# Patient Record
Sex: Female | Born: 1965 | Race: White | Hispanic: No | Marital: Married | Smoking: Never smoker
Health system: Southern US, Community
[De-identification: ages and names within clinical notes are randomized; demographics above are authoritative.]

---

## 1997-08-06 ENCOUNTER — Other Ambulatory Visit: Admission: RE | Admit: 1997-08-06 | Discharge: 1997-08-06 | Payer: Self-pay | Admitting: *Deleted

## 1998-07-13 ENCOUNTER — Inpatient Hospital Stay (HOSPITAL_COMMUNITY): Admission: AD | Admit: 1998-07-13 | Discharge: 1998-07-16 | Payer: Self-pay | Admitting: *Deleted

## 1998-08-27 ENCOUNTER — Other Ambulatory Visit: Admission: RE | Admit: 1998-08-27 | Discharge: 1998-08-27 | Payer: Self-pay | Admitting: *Deleted

## 1998-12-30 ENCOUNTER — Other Ambulatory Visit: Admission: RE | Admit: 1998-12-30 | Discharge: 1998-12-30 | Payer: Self-pay | Admitting: *Deleted

## 1999-03-08 ENCOUNTER — Encounter: Payer: Self-pay | Admitting: *Deleted

## 1999-03-08 ENCOUNTER — Ambulatory Visit (HOSPITAL_COMMUNITY): Admission: RE | Admit: 1999-03-08 | Discharge: 1999-03-08 | Payer: Self-pay | Admitting: *Deleted

## 1999-09-06 ENCOUNTER — Other Ambulatory Visit: Admission: RE | Admit: 1999-09-06 | Discharge: 1999-09-06 | Payer: Self-pay | Admitting: *Deleted

## 1999-11-25 ENCOUNTER — Other Ambulatory Visit: Admission: RE | Admit: 1999-11-25 | Discharge: 1999-11-25 | Payer: Self-pay | Admitting: Obstetrics and Gynecology

## 1999-11-25 ENCOUNTER — Encounter (INDEPENDENT_AMBULATORY_CARE_PROVIDER_SITE_OTHER): Payer: Self-pay

## 2000-03-17 ENCOUNTER — Other Ambulatory Visit: Admission: RE | Admit: 2000-03-17 | Discharge: 2000-03-17 | Payer: Self-pay | Admitting: *Deleted

## 2000-04-05 ENCOUNTER — Encounter: Payer: Self-pay | Admitting: *Deleted

## 2000-04-05 ENCOUNTER — Ambulatory Visit (HOSPITAL_COMMUNITY): Admission: RE | Admit: 2000-04-05 | Discharge: 2000-04-05 | Payer: Self-pay | Admitting: *Deleted

## 2000-09-12 ENCOUNTER — Other Ambulatory Visit: Admission: RE | Admit: 2000-09-12 | Discharge: 2000-09-12 | Payer: Self-pay | Admitting: *Deleted

## 2001-05-02 ENCOUNTER — Ambulatory Visit (HOSPITAL_COMMUNITY): Admission: RE | Admit: 2001-05-02 | Discharge: 2001-05-02 | Payer: Self-pay | Admitting: *Deleted

## 2001-05-02 ENCOUNTER — Encounter: Payer: Self-pay | Admitting: *Deleted

## 2001-09-14 ENCOUNTER — Other Ambulatory Visit: Admission: RE | Admit: 2001-09-14 | Discharge: 2001-09-14 | Payer: Self-pay | Admitting: Obstetrics and Gynecology

## 2002-05-22 ENCOUNTER — Ambulatory Visit (HOSPITAL_COMMUNITY): Admission: RE | Admit: 2002-05-22 | Discharge: 2002-05-22 | Payer: Self-pay | Admitting: Obstetrics and Gynecology

## 2002-05-22 ENCOUNTER — Encounter: Payer: Self-pay | Admitting: Obstetrics and Gynecology

## 2002-09-23 ENCOUNTER — Other Ambulatory Visit: Admission: RE | Admit: 2002-09-23 | Discharge: 2002-09-23 | Payer: Self-pay | Admitting: Obstetrics and Gynecology

## 2003-06-18 ENCOUNTER — Ambulatory Visit (HOSPITAL_COMMUNITY): Admission: RE | Admit: 2003-06-18 | Discharge: 2003-06-18 | Payer: Self-pay | Admitting: Obstetrics and Gynecology

## 2004-07-12 ENCOUNTER — Ambulatory Visit (HOSPITAL_COMMUNITY): Admission: RE | Admit: 2004-07-12 | Discharge: 2004-07-12 | Payer: Self-pay | Admitting: Obstetrics and Gynecology

## 2005-07-14 ENCOUNTER — Ambulatory Visit (HOSPITAL_COMMUNITY): Admission: RE | Admit: 2005-07-14 | Discharge: 2005-07-14 | Payer: Self-pay | Admitting: Obstetrics and Gynecology

## 2006-09-14 ENCOUNTER — Ambulatory Visit (HOSPITAL_COMMUNITY): Admission: RE | Admit: 2006-09-14 | Discharge: 2006-09-14 | Payer: Self-pay | Admitting: Obstetrics and Gynecology

## 2007-09-24 ENCOUNTER — Ambulatory Visit (HOSPITAL_COMMUNITY): Admission: RE | Admit: 2007-09-24 | Discharge: 2007-09-24 | Payer: Self-pay | Admitting: Obstetrics and Gynecology

## 2008-10-23 ENCOUNTER — Ambulatory Visit (HOSPITAL_COMMUNITY): Admission: RE | Admit: 2008-10-23 | Discharge: 2008-10-23 | Payer: Self-pay | Admitting: Obstetrics and Gynecology

## 2009-11-12 ENCOUNTER — Ambulatory Visit (HOSPITAL_COMMUNITY): Admission: RE | Admit: 2009-11-12 | Discharge: 2009-11-12 | Payer: Self-pay | Admitting: Obstetrics and Gynecology

## 2010-11-04 ENCOUNTER — Other Ambulatory Visit (HOSPITAL_COMMUNITY): Payer: Self-pay | Admitting: Obstetrics and Gynecology

## 2010-11-04 DIAGNOSIS — Z1231 Encounter for screening mammogram for malignant neoplasm of breast: Secondary | ICD-10-CM

## 2010-11-15 ENCOUNTER — Ambulatory Visit (HOSPITAL_COMMUNITY)
Admission: RE | Admit: 2010-11-15 | Discharge: 2010-11-15 | Disposition: A | Discharge status: Home / Self Care | Payer: BC Managed Care – PPO | Source: Ambulatory Visit | Attending: Obstetrics and Gynecology | Admitting: Obstetrics and Gynecology

## 2010-11-15 DIAGNOSIS — Z1231 Encounter for screening mammogram for malignant neoplasm of breast: Secondary | ICD-10-CM | POA: Insufficient documentation

## 2011-11-09 ENCOUNTER — Other Ambulatory Visit (HOSPITAL_COMMUNITY): Payer: Self-pay | Admitting: Obstetrics and Gynecology

## 2011-11-09 DIAGNOSIS — Z1231 Encounter for screening mammogram for malignant neoplasm of breast: Secondary | ICD-10-CM

## 2011-11-29 ENCOUNTER — Ambulatory Visit (HOSPITAL_COMMUNITY): Payer: BC Managed Care – PPO

## 2011-12-13 ENCOUNTER — Ambulatory Visit (HOSPITAL_COMMUNITY): Payer: BC Managed Care – PPO

## 2012-01-03 ENCOUNTER — Ambulatory Visit (HOSPITAL_COMMUNITY): Payer: BC Managed Care – PPO | Attending: Obstetrics and Gynecology

## 2012-01-27 ENCOUNTER — Ambulatory Visit (HOSPITAL_COMMUNITY)
Admission: RE | Admit: 2012-01-27 | Discharge: 2012-01-27 | Disposition: A | Discharge status: Home / Self Care | Payer: BC Managed Care – PPO | Source: Ambulatory Visit | Attending: Obstetrics and Gynecology | Admitting: Obstetrics and Gynecology

## 2012-01-27 DIAGNOSIS — Z1231 Encounter for screening mammogram for malignant neoplasm of breast: Secondary | ICD-10-CM | POA: Insufficient documentation

## 2012-02-02 ENCOUNTER — Ambulatory Visit (HOSPITAL_COMMUNITY): Payer: BC Managed Care – PPO

## 2013-03-12 ENCOUNTER — Other Ambulatory Visit (HOSPITAL_COMMUNITY): Payer: Self-pay | Admitting: Obstetrics and Gynecology

## 2013-03-12 DIAGNOSIS — Z1231 Encounter for screening mammogram for malignant neoplasm of breast: Secondary | ICD-10-CM

## 2013-03-13 ENCOUNTER — Ambulatory Visit (HOSPITAL_COMMUNITY)
Admission: RE | Admit: 2013-03-13 | Discharge: 2013-03-13 | Disposition: A | Discharge status: Home / Self Care | Payer: BC Managed Care – PPO | Source: Ambulatory Visit | Attending: Obstetrics and Gynecology | Admitting: Obstetrics and Gynecology

## 2013-03-13 DIAGNOSIS — Z1231 Encounter for screening mammogram for malignant neoplasm of breast: Secondary | ICD-10-CM | POA: Insufficient documentation

## 2013-03-29 ENCOUNTER — Other Ambulatory Visit: Payer: Self-pay | Admitting: Obstetrics and Gynecology

## 2013-03-29 DIAGNOSIS — R928 Other abnormal and inconclusive findings on diagnostic imaging of breast: Secondary | ICD-10-CM

## 2013-04-04 ENCOUNTER — Ambulatory Visit
Admission: RE | Admit: 2013-04-04 | Discharge: 2013-04-04 | Disposition: A | Discharge status: Home / Self Care | Payer: BC Managed Care – PPO | Source: Ambulatory Visit | Attending: Obstetrics and Gynecology | Admitting: Obstetrics and Gynecology

## 2013-04-04 DIAGNOSIS — R928 Other abnormal and inconclusive findings on diagnostic imaging of breast: Secondary | ICD-10-CM

## 2014-04-22 ENCOUNTER — Other Ambulatory Visit (HOSPITAL_COMMUNITY): Payer: Self-pay | Admitting: Obstetrics and Gynecology

## 2014-04-22 DIAGNOSIS — Z1231 Encounter for screening mammogram for malignant neoplasm of breast: Secondary | ICD-10-CM

## 2014-05-01 ENCOUNTER — Ambulatory Visit (HOSPITAL_COMMUNITY): Payer: BC Managed Care – PPO

## 2014-05-08 ENCOUNTER — Ambulatory Visit (HOSPITAL_COMMUNITY)
Admission: RE | Admit: 2014-05-08 | Discharge: 2014-05-08 | Disposition: A | Discharge status: Home / Self Care | Payer: BC Managed Care – PPO | Source: Ambulatory Visit | Attending: Obstetrics and Gynecology | Admitting: Obstetrics and Gynecology

## 2014-05-08 DIAGNOSIS — Z1231 Encounter for screening mammogram for malignant neoplasm of breast: Secondary | ICD-10-CM | POA: Insufficient documentation

## 2015-07-08 ENCOUNTER — Other Ambulatory Visit: Payer: Self-pay

## 2015-07-08 DIAGNOSIS — Z1231 Encounter for screening mammogram for malignant neoplasm of breast: Secondary | ICD-10-CM

## 2015-07-10 ENCOUNTER — Ambulatory Visit
Admission: RE | Admit: 2015-07-10 | Discharge: 2015-07-10 | Disposition: A | Discharge status: Home / Self Care | Payer: BC Managed Care – PPO | Source: Ambulatory Visit

## 2015-07-10 DIAGNOSIS — Z1231 Encounter for screening mammogram for malignant neoplasm of breast: Secondary | ICD-10-CM

## 2016-08-02 ENCOUNTER — Other Ambulatory Visit: Payer: Self-pay | Admitting: Obstetrics and Gynecology

## 2016-08-02 DIAGNOSIS — Z1231 Encounter for screening mammogram for malignant neoplasm of breast: Secondary | ICD-10-CM

## 2016-08-29 ENCOUNTER — Ambulatory Visit
Admission: RE | Admit: 2016-08-29 | Discharge: 2016-08-29 | Disposition: A | Discharge status: Home / Self Care | Payer: BC Managed Care – PPO | Source: Ambulatory Visit | Attending: Obstetrics and Gynecology | Admitting: Obstetrics and Gynecology

## 2016-08-29 DIAGNOSIS — Z1231 Encounter for screening mammogram for malignant neoplasm of breast: Secondary | ICD-10-CM

## 2017-08-25 ENCOUNTER — Other Ambulatory Visit: Payer: Self-pay | Admitting: Obstetrics and Gynecology

## 2017-08-25 DIAGNOSIS — Z1231 Encounter for screening mammogram for malignant neoplasm of breast: Secondary | ICD-10-CM

## 2017-09-13 ENCOUNTER — Ambulatory Visit
Admission: RE | Admit: 2017-09-13 | Discharge: 2017-09-13 | Disposition: A | Discharge status: Home / Self Care | Payer: BC Managed Care – PPO | Source: Ambulatory Visit | Attending: Obstetrics and Gynecology | Admitting: Obstetrics and Gynecology

## 2017-09-13 DIAGNOSIS — Z1231 Encounter for screening mammogram for malignant neoplasm of breast: Secondary | ICD-10-CM

## 2018-08-29 ENCOUNTER — Other Ambulatory Visit: Payer: Self-pay | Admitting: Obstetrics and Gynecology

## 2018-08-29 DIAGNOSIS — Z1231 Encounter for screening mammogram for malignant neoplasm of breast: Secondary | ICD-10-CM

## 2018-10-15 ENCOUNTER — Ambulatory Visit
Admission: RE | Admit: 2018-10-15 | Discharge: 2018-10-15 | Disposition: A | Discharge status: Home / Self Care | Payer: BC Managed Care – PPO | Source: Ambulatory Visit | Attending: Obstetrics and Gynecology | Admitting: Obstetrics and Gynecology

## 2018-10-15 ENCOUNTER — Other Ambulatory Visit: Payer: Self-pay

## 2018-10-15 DIAGNOSIS — Z1231 Encounter for screening mammogram for malignant neoplasm of breast: Secondary | ICD-10-CM

## 2019-06-04 ENCOUNTER — Inpatient Hospital Stay (HOSPITAL_COMMUNITY): Payer: BC Managed Care – PPO | Admitting: Certified Registered Nurse Anesthetist

## 2019-06-04 ENCOUNTER — Emergency Department (HOSPITAL_COMMUNITY): Payer: BC Managed Care – PPO

## 2019-06-04 ENCOUNTER — Inpatient Hospital Stay (HOSPITAL_COMMUNITY): Payer: BC Managed Care – PPO

## 2019-06-04 ENCOUNTER — Inpatient Hospital Stay (HOSPITAL_COMMUNITY)
Admission: EM | Admit: 2019-06-04 | Discharge: 2019-06-11 | DRG: 493 | Disposition: A | Discharge status: Home Health | Payer: BC Managed Care – PPO | Attending: Student | Admitting: Student

## 2019-06-04 ENCOUNTER — Encounter (HOSPITAL_COMMUNITY)
Admission: EM | Disposition: A | Discharge status: Home Health | Payer: Self-pay | Source: Home / Self Care | Attending: Student

## 2019-06-04 ENCOUNTER — Encounter (HOSPITAL_COMMUNITY): Payer: Self-pay | Admitting: Orthopaedic Surgery

## 2019-06-04 ENCOUNTER — Other Ambulatory Visit: Payer: Self-pay

## 2019-06-04 DIAGNOSIS — S82872A Displaced pilon fracture of left tibia, initial encounter for closed fracture: Principal | ICD-10-CM

## 2019-06-04 DIAGNOSIS — Z803 Family history of malignant neoplasm of breast: Secondary | ICD-10-CM | POA: Diagnosis not present

## 2019-06-04 DIAGNOSIS — D62 Acute posthemorrhagic anemia: Secondary | ICD-10-CM | POA: Diagnosis not present

## 2019-06-04 DIAGNOSIS — S82209A Unspecified fracture of shaft of unspecified tibia, initial encounter for closed fracture: Secondary | ICD-10-CM | POA: Insufficient documentation

## 2019-06-04 DIAGNOSIS — Z751 Person awaiting admission to adequate facility elsewhere: Secondary | ICD-10-CM | POA: Diagnosis not present

## 2019-06-04 DIAGNOSIS — Z20822 Contact with and (suspected) exposure to covid-19: Secondary | ICD-10-CM | POA: Diagnosis present

## 2019-06-04 DIAGNOSIS — M25572 Pain in left ankle and joints of left foot: Secondary | ICD-10-CM | POA: Diagnosis present

## 2019-06-04 DIAGNOSIS — T148XXA Other injury of unspecified body region, initial encounter: Secondary | ICD-10-CM

## 2019-06-04 DIAGNOSIS — S62109A Fracture of unspecified carpal bone, unspecified wrist, initial encounter for closed fracture: Secondary | ICD-10-CM

## 2019-06-04 DIAGNOSIS — Z09 Encounter for follow-up examination after completed treatment for conditions other than malignant neoplasm: Secondary | ICD-10-CM

## 2019-06-04 DIAGNOSIS — Z79899 Other long term (current) drug therapy: Secondary | ICD-10-CM | POA: Diagnosis not present

## 2019-06-04 DIAGNOSIS — S82392A Other fracture of lower end of left tibia, initial encounter for closed fracture: Secondary | ICD-10-CM

## 2019-06-04 DIAGNOSIS — Z419 Encounter for procedure for purposes other than remedying health state, unspecified: Secondary | ICD-10-CM

## 2019-06-04 DIAGNOSIS — S52552A Other extraarticular fracture of lower end of left radius, initial encounter for closed fracture: Secondary | ICD-10-CM | POA: Diagnosis present

## 2019-06-04 DIAGNOSIS — W010XXA Fall on same level from slipping, tripping and stumbling without subsequent striking against object, initial encounter: Secondary | ICD-10-CM | POA: Diagnosis present

## 2019-06-04 DIAGNOSIS — Z880 Allergy status to penicillin: Secondary | ICD-10-CM | POA: Diagnosis not present

## 2019-06-04 DIAGNOSIS — S52502A Unspecified fracture of the lower end of left radius, initial encounter for closed fracture: Secondary | ICD-10-CM | POA: Diagnosis present

## 2019-06-04 DIAGNOSIS — W19XXXA Unspecified fall, initial encounter: Secondary | ICD-10-CM

## 2019-06-04 HISTORY — PX: EXTERNAL FIXATION LEG: SHX1549

## 2019-06-04 HISTORY — PX: ORIF WRIST FRACTURE: SHX2133

## 2019-06-04 LAB — CBC
HCT: 36.8 % (ref 36.0–46.0)
Hemoglobin: 11.9 g/dL — ABNORMAL LOW (ref 12.0–15.0)
MCH: 30.1 pg (ref 26.0–34.0)
MCHC: 32.3 g/dL (ref 30.0–36.0)
MCV: 92.9 fL (ref 80.0–100.0)
Platelets: 226 10*3/uL (ref 150–400)
RBC: 3.96 MIL/uL (ref 3.87–5.11)
RDW: 12.9 % (ref 11.5–15.5)
WBC: 9.2 10*3/uL (ref 4.0–10.5)
nRBC: 0 % (ref 0.0–0.2)

## 2019-06-04 LAB — BASIC METABOLIC PANEL
Anion gap: 10 (ref 5–15)
BUN: 17 mg/dL (ref 6–20)
CO2: 26 mmol/L (ref 22–32)
Calcium: 8.7 mg/dL — ABNORMAL LOW (ref 8.9–10.3)
Chloride: 102 mmol/L (ref 98–111)
Creatinine, Ser: 0.86 mg/dL (ref 0.44–1.00)
GFR calc Af Amer: >60 mL/min (ref >60)
GFR calc non Af Amer: >60 mL/min (ref >60)
Glucose, Bld: 150 mg/dL — ABNORMAL HIGH (ref 70–99)
Potassium: 3.8 mmol/L (ref 3.5–5.1)
Sodium: 138 mmol/L (ref 135–145)

## 2019-06-04 LAB — RESPIRATORY PANEL BY RT PCR (FLU A&B, COVID)
Influenza A by PCR: NEGATIVE
Influenza B by PCR: NEGATIVE
SARS Coronavirus 2 by RT PCR: NEGATIVE

## 2019-06-04 LAB — SURGICAL PCR SCREEN
MRSA, PCR: NEGATIVE
Staphylococcus aureus: NEGATIVE

## 2019-06-04 SURGERY — OPEN REDUCTION INTERNAL FIXATION (ORIF) WRIST FRACTURE
Anesthesia: General | Site: Wrist | Laterality: Left

## 2019-06-04 MED ORDER — EPHEDRINE SULFATE-NACL 50-0.9 MG/10ML-% IV SOSY
PREFILLED_SYRINGE | INTRAVENOUS | Status: DC | PRN
  Administered 2019-06-04: 5 mg via INTRAVENOUS

## 2019-06-04 MED ORDER — LACTATED RINGERS IV SOLN: INTRAVENOUS | Status: DC

## 2019-06-04 MED ORDER — VANCOMYCIN HCL IN DEXTROSE 1-5 GM/200ML-% IV SOLN
1000.0000 mg | Freq: Two times a day (BID) | INTRAVENOUS | Status: AC
  Administered 2019-06-05: 1000 mg via INTRAVENOUS
  Filled 2019-06-04 (×2): qty 200

## 2019-06-04 MED ORDER — 0.9 % SODIUM CHLORIDE (POUR BTL) OPTIME
TOPICAL | Status: DC | PRN
  Administered 2019-06-04: 1000 mL

## 2019-06-04 MED ORDER — METOCLOPRAMIDE HCL 5 MG/ML IJ SOLN: 5.0000 mg | Freq: Three times a day (TID) | INTRAMUSCULAR | Status: DC | PRN

## 2019-06-04 MED ORDER — CHLORHEXIDINE GLUCONATE 4 % EX LIQD
60.0000 mL | Freq: Once | CUTANEOUS | Status: AC
  Administered 2019-06-04: 4 via TOPICAL
  Filled 2019-06-04 (×2): qty 60

## 2019-06-04 MED ORDER — METHOCARBAMOL 500 MG PO TABS
500.0000 mg | ORAL_TABLET | Freq: Four times a day (QID) | ORAL | Status: DC | PRN
  Administered 2019-06-05 – 2019-06-08 (×4): 500 mg via ORAL
  Filled 2019-06-04 (×4): qty 1

## 2019-06-04 MED ORDER — ACETAMINOPHEN 500 MG PO TABS
1000.0000 mg | ORAL_TABLET | Freq: Once | ORAL | Status: AC
  Administered 2019-06-04: 1000 mg via ORAL
  Filled 2019-06-04: qty 2

## 2019-06-04 MED ORDER — BUPIVACAINE HCL (PF) 0.25 % IJ SOLN
INTRAMUSCULAR | Status: DC | PRN
  Administered 2019-06-04: 10 mL

## 2019-06-04 MED ORDER — VANCOMYCIN HCL 1000 MG IV SOLR
INTRAVENOUS | Status: DC | PRN
  Administered 2019-06-04: 1 g via TOPICAL

## 2019-06-04 MED ORDER — SUCCINYLCHOLINE CHLORIDE 200 MG/10ML IV SOSY
PREFILLED_SYRINGE | INTRAVENOUS | Status: DC | PRN
  Administered 2019-06-04: 120 mg via INTRAVENOUS

## 2019-06-04 MED ORDER — HYDROMORPHONE HCL 1 MG/ML IJ SOLN
0.5000 mg | INTRAMUSCULAR | Status: DC | PRN
  Administered 2019-06-04 (×2): 0.5 mg via INTRAVENOUS
  Filled 2019-06-04 (×2): qty 1

## 2019-06-04 MED ORDER — CLINDAMYCIN PHOSPHATE 900 MG/50ML IV SOLN
900.0000 mg | INTRAVENOUS | Status: AC
  Administered 2019-06-04: 19:00:00 900 mg via INTRAVENOUS
  Filled 2019-06-04: qty 50

## 2019-06-04 MED ORDER — SUGAMMADEX SODIUM 200 MG/2ML IV SOLN
INTRAVENOUS | Status: DC | PRN
  Administered 2019-06-04: 150 mg via INTRAVENOUS

## 2019-06-04 MED ORDER — SCOPOLAMINE 1 MG/3DAYS TD PT72
1.0000 | MEDICATED_PATCH | Freq: Once | TRANSDERMAL | Status: DC
  Administered 2019-06-04: 1.5 mg via TRANSDERMAL
  Filled 2019-06-04: qty 1

## 2019-06-04 MED ORDER — BISACODYL 5 MG PO TBEC: 5.0000 mg | DELAYED_RELEASE_TABLET | Freq: Every day | ORAL | Status: DC | PRN

## 2019-06-04 MED ORDER — DEXAMETHASONE SODIUM PHOSPHATE 10 MG/ML IJ SOLN
INTRAMUSCULAR | Status: AC
  Filled 2019-06-04: qty 2

## 2019-06-04 MED ORDER — OXYCODONE HCL 5 MG PO TABS
5.0000 mg | ORAL_TABLET | ORAL | Status: DC | PRN
  Administered 2019-06-05 – 2019-06-09 (×3): 5 mg via ORAL
  Filled 2019-06-04: qty 2
  Filled 2019-06-04 (×4): qty 1
  Filled 2019-06-04: qty 2

## 2019-06-04 MED ORDER — POVIDONE-IODINE 10 % EX SWAB: 2.0000 "application " | Freq: Once | CUTANEOUS | Status: DC

## 2019-06-04 MED ORDER — ONDANSETRON HCL 4 MG PO TABS: 4.0000 mg | ORAL_TABLET | Freq: Four times a day (QID) | ORAL | Status: DC | PRN

## 2019-06-04 MED ORDER — EPHEDRINE 5 MG/ML INJ
INTRAVENOUS | Status: AC
  Filled 2019-06-04: qty 10

## 2019-06-04 MED ORDER — DOCUSATE SODIUM 100 MG PO CAPS
100.0000 mg | ORAL_CAPSULE | Freq: Two times a day (BID) | ORAL | Status: DC
  Administered 2019-06-04 – 2019-06-09 (×10): 100 mg via ORAL
  Filled 2019-06-04 (×12): qty 1

## 2019-06-04 MED ORDER — METOCLOPRAMIDE HCL 5 MG PO TABS: 5.0000 mg | ORAL_TABLET | Freq: Three times a day (TID) | ORAL | Status: DC | PRN

## 2019-06-04 MED ORDER — MIDAZOLAM HCL 2 MG/2ML IJ SOLN
INTRAMUSCULAR | Status: AC
  Filled 2019-06-04: qty 2

## 2019-06-04 MED ORDER — PROPOFOL 10 MG/ML IV BOLUS
INTRAVENOUS | Status: DC | PRN
  Administered 2019-06-04: 140 mg via INTRAVENOUS

## 2019-06-04 MED ORDER — HYDROMORPHONE HCL 1 MG/ML IJ SOLN
0.5000 mg | INTRAMUSCULAR | Status: DC | PRN
  Administered 2019-06-04 – 2019-06-07 (×2): 1 mg via INTRAVENOUS
  Filled 2019-06-04 (×2): qty 1

## 2019-06-04 MED ORDER — CLINDAMYCIN PHOSPHATE 900 MG/50ML IV SOLN
INTRAVENOUS | Status: AC
  Filled 2019-06-04: qty 50

## 2019-06-04 MED ORDER — FENTANYL CITRATE (PF) 250 MCG/5ML IJ SOLN
INTRAMUSCULAR | Status: AC
  Filled 2019-06-04: qty 5

## 2019-06-04 MED ORDER — ENOXAPARIN SODIUM 40 MG/0.4ML ~~LOC~~ SOLN: 40.0000 mg | SUBCUTANEOUS | Status: DC

## 2019-06-04 MED ORDER — METHOCARBAMOL 1000 MG/10ML IJ SOLN
500.0000 mg | Freq: Four times a day (QID) | INTRAVENOUS | Status: DC | PRN
  Filled 2019-06-04: qty 5

## 2019-06-04 MED ORDER — HYDROMORPHONE HCL 1 MG/ML IJ SOLN: 0.2500 mg | INTRAMUSCULAR | Status: DC | PRN

## 2019-06-04 MED ORDER — ONDANSETRON HCL 4 MG/2ML IJ SOLN: 4.0000 mg | Freq: Four times a day (QID) | INTRAMUSCULAR | Status: DC | PRN

## 2019-06-04 MED ORDER — MAGNESIUM CITRATE PO SOLN: 1.0000 | Freq: Once | ORAL | Status: DC | PRN

## 2019-06-04 MED ORDER — HYDROMORPHONE HCL 1 MG/ML IJ SOLN
1.0000 mg | Freq: Once | INTRAMUSCULAR | Status: AC
  Administered 2019-06-04: 12:00:00 1 mg via INTRAVENOUS
  Filled 2019-06-04: qty 1

## 2019-06-04 MED ORDER — LIDOCAINE 2% (20 MG/ML) 5 ML SYRINGE
INTRAMUSCULAR | Status: DC | PRN
  Administered 2019-06-04: 30 mg via INTRAVENOUS

## 2019-06-04 MED ORDER — ZOLPIDEM TARTRATE 5 MG PO TABS: 5.0000 mg | ORAL_TABLET | Freq: Every evening | ORAL | Status: DC | PRN

## 2019-06-04 MED ORDER — VANCOMYCIN HCL 1000 MG IV SOLR
INTRAVENOUS | Status: AC
  Filled 2019-06-04: qty 1000

## 2019-06-04 MED ORDER — SUCCINYLCHOLINE CHLORIDE 200 MG/10ML IV SOSY
PREFILLED_SYRINGE | INTRAVENOUS | Status: AC
  Filled 2019-06-04: qty 10

## 2019-06-04 MED ORDER — ENOXAPARIN SODIUM 40 MG/0.4ML ~~LOC~~ SOLN
40.0000 mg | SUBCUTANEOUS | Status: DC
  Administered 2019-06-05: 40 mg via SUBCUTANEOUS
  Filled 2019-06-04: qty 0.4

## 2019-06-04 MED ORDER — PROPOFOL 10 MG/ML IV BOLUS
INTRAVENOUS | Status: AC | PRN
  Administered 2019-06-04 (×2): 50 mg via INTRAVENOUS

## 2019-06-04 MED ORDER — ROCURONIUM BROMIDE 10 MG/ML (PF) SYRINGE
PREFILLED_SYRINGE | INTRAVENOUS | Status: AC
  Filled 2019-06-04: qty 10

## 2019-06-04 MED ORDER — SERTRALINE HCL 100 MG PO TABS
100.0000 mg | ORAL_TABLET | Freq: Every day | ORAL | Status: DC
  Administered 2019-06-05 – 2019-06-11 (×6): 100 mg via ORAL
  Filled 2019-06-04 (×6): qty 1

## 2019-06-04 MED ORDER — MEPERIDINE HCL 25 MG/ML IJ SOLN
INTRAMUSCULAR | Status: AC
  Filled 2019-06-04: qty 1

## 2019-06-04 MED ORDER — POLYETHYLENE GLYCOL 3350 17 G PO PACK
17.0000 g | PACK | Freq: Every day | ORAL | Status: DC | PRN
  Administered 2019-06-08: 17 g via ORAL
  Filled 2019-06-04: qty 1

## 2019-06-04 MED ORDER — OXYCODONE HCL 5 MG PO TABS
10.0000 mg | ORAL_TABLET | ORAL | Status: DC | PRN
  Administered 2019-06-05 (×2): 10 mg via ORAL
  Administered 2019-06-07: 15 mg via ORAL
  Administered 2019-06-07: 10 mg via ORAL
  Administered 2019-06-07 (×2): 15 mg via ORAL
  Administered 2019-06-08: 10 mg via ORAL
  Filled 2019-06-04: qty 3
  Filled 2019-06-04: qty 2
  Filled 2019-06-04 (×3): qty 3

## 2019-06-04 MED ORDER — ONDANSETRON HCL 4 MG/2ML IJ SOLN
4.0000 mg | Freq: Four times a day (QID) | INTRAMUSCULAR | Status: DC | PRN
  Administered 2019-06-04: 4 mg via INTRAVENOUS

## 2019-06-04 MED ORDER — ALBUMIN HUMAN 5 % IV SOLN: INTRAVENOUS | Status: DC | PRN

## 2019-06-04 MED ORDER — FENTANYL CITRATE (PF) 250 MCG/5ML IJ SOLN
INTRAMUSCULAR | Status: DC | PRN
  Administered 2019-06-04: 150 ug via INTRAVENOUS

## 2019-06-04 MED ORDER — PANTOPRAZOLE SODIUM 40 MG PO TBEC
40.0000 mg | DELAYED_RELEASE_TABLET | Freq: Every day | ORAL | Status: DC
  Administered 2019-06-04 – 2019-06-11 (×7): 40 mg via ORAL
  Filled 2019-06-04 (×8): qty 1

## 2019-06-04 MED ORDER — MEPERIDINE HCL 25 MG/ML IJ SOLN: 6.2500 mg | INTRAMUSCULAR | Status: DC | PRN

## 2019-06-04 MED ORDER — LIDOCAINE 2% (20 MG/ML) 5 ML SYRINGE
INTRAMUSCULAR | Status: AC
  Filled 2019-06-04: qty 5

## 2019-06-04 MED ORDER — ACETAMINOPHEN 500 MG PO TABS
1000.0000 mg | ORAL_TABLET | Freq: Three times a day (TID) | ORAL | Status: DC
  Administered 2019-06-05 – 2019-06-08 (×11): 1000 mg via ORAL
  Filled 2019-06-04 (×11): qty 2

## 2019-06-04 MED ORDER — ROCURONIUM BROMIDE 50 MG/5ML IV SOSY
PREFILLED_SYRINGE | INTRAVENOUS | Status: DC | PRN
  Administered 2019-06-04: 50 mg via INTRAVENOUS

## 2019-06-04 MED ORDER — PROPOFOL 10 MG/ML IV BOLUS
INTRAVENOUS | Status: AC
  Filled 2019-06-04: qty 20

## 2019-06-04 MED ORDER — ONDANSETRON HCL 4 MG/2ML IJ SOLN
INTRAMUSCULAR | Status: AC
  Filled 2019-06-04: qty 2

## 2019-06-04 MED ORDER — PROPOFOL 10 MG/ML IV BOLUS
0.5000 mg/kg | Freq: Once | INTRAVENOUS | Status: DC
  Filled 2019-06-04: qty 20

## 2019-06-04 MED ORDER — NAPROXEN 250 MG PO TABS
250.0000 mg | ORAL_TABLET | Freq: Two times a day (BID) | ORAL | Status: DC
  Administered 2019-06-05 (×2): 250 mg via ORAL
  Filled 2019-06-04 (×4): qty 1

## 2019-06-04 MED ORDER — METHOCARBAMOL 1000 MG/10ML IJ SOLN
500.0000 mg | Freq: Four times a day (QID) | INTRAVENOUS | Status: DC | PRN
  Filled 2019-06-04 (×3): qty 5

## 2019-06-04 MED ORDER — SODIUM CHLORIDE 0.9 % IV SOLN
INTRAVENOUS | Status: AC | PRN
  Administered 2019-06-04: 1000 mL via INTRAVENOUS

## 2019-06-04 MED ORDER — PHENYLEPHRINE 40 MCG/ML (10ML) SYRINGE FOR IV PUSH (FOR BLOOD PRESSURE SUPPORT)
PREFILLED_SYRINGE | INTRAVENOUS | Status: AC
  Filled 2019-06-04: qty 10

## 2019-06-04 MED ORDER — MIDAZOLAM HCL 5 MG/5ML IJ SOLN
INTRAMUSCULAR | Status: DC | PRN
  Administered 2019-06-04: 2 mg via INTRAVENOUS

## 2019-06-04 MED ORDER — MEPERIDINE HCL 25 MG/ML IJ SOLN
12.5000 mg | Freq: Once | INTRAMUSCULAR | Status: AC
  Administered 2019-06-04: 12.5 mg via INTRAVENOUS

## 2019-06-04 MED ORDER — HYDROMORPHONE HCL 1 MG/ML IJ SOLN
1.0000 mg | Freq: Once | INTRAMUSCULAR | Status: DC
  Filled 2019-06-04: qty 1

## 2019-06-04 MED ORDER — ONDANSETRON HCL 4 MG/2ML IJ SOLN
4.0000 mg | Freq: Once | INTRAMUSCULAR | Status: AC
  Administered 2019-06-04: 4 mg via INTRAVENOUS
  Filled 2019-06-04: qty 2

## 2019-06-04 MED ORDER — BUPIVACAINE HCL (PF) 0.25 % IJ SOLN
INTRAMUSCULAR | Status: AC
  Filled 2019-06-04: qty 30

## 2019-06-04 MED ORDER — DIPHENHYDRAMINE HCL 12.5 MG/5ML PO ELIX: 12.5000 mg | ORAL_SOLUTION | ORAL | Status: DC | PRN

## 2019-06-04 MED ORDER — METHOCARBAMOL 500 MG PO TABS: 500.0000 mg | ORAL_TABLET | Freq: Four times a day (QID) | ORAL | Status: DC | PRN

## 2019-06-04 MED ORDER — DEXAMETHASONE SODIUM PHOSPHATE 10 MG/ML IJ SOLN
INTRAMUSCULAR | Status: DC | PRN
  Administered 2019-06-04: 10 mg via INTRAVENOUS

## 2019-06-04 SURGICAL SUPPLY — 74 items
ALCOHOL 70% 16 OZ (MISCELLANEOUS) ×4 IMPLANT
BAR EXFX 350X11 NS LF (EXFIX) ×2
BAR EXFX 400X11 NS LF (EXFIX) ×2
BAR GLASS FIBER EXFX 11X350 (EXFIX) ×2 IMPLANT
BAR GLASS FIBER EXFX 11X400 (EXFIX) ×2 IMPLANT
BIT DRILL 2.2 SS TIBIAL (BIT) ×2 IMPLANT
BLADE CLIPPER SURG (BLADE) IMPLANT
BNDG CMPR 9X4 STRL LF SNTH (GAUZE/BANDAGES/DRESSINGS) ×2
BNDG ELASTIC 4X5.8 VLCR STR LF (GAUZE/BANDAGES/DRESSINGS) ×4 IMPLANT
BNDG ESMARK 4X9 LF (GAUZE/BANDAGES/DRESSINGS) ×4 IMPLANT
BNDG GAUZE ELAST 4 BULKY (GAUZE/BANDAGES/DRESSINGS) ×4 IMPLANT
CANISTER SUCT 3000ML PPV (MISCELLANEOUS) ×4 IMPLANT
CLAMP PIN 2 BAR 45MM EXFIX (EXFIX) ×2 IMPLANT
CLAMP PIN 45MM 1 BAR (EXFIX) ×4 IMPLANT
CLOSURE STERI-STRIP 1/2X4 (GAUZE/BANDAGES/DRESSINGS) ×1
CLSR STERI-STRIP ANTIMIC 1/2X4 (GAUZE/BANDAGES/DRESSINGS) ×1 IMPLANT
CORD BIPOLAR FORCEPS 12FT (ELECTRODE) ×4 IMPLANT
COVER SURGICAL LIGHT HANDLE (MISCELLANEOUS) ×4 IMPLANT
CUFF TOURN SGL QUICK 18X4 (TOURNIQUET CUFF) ×2 IMPLANT
DECANTER SPIKE VIAL GLASS SM (MISCELLANEOUS) IMPLANT
DRAPE OEC MINIVIEW 54X84 (DRAPES) ×4 IMPLANT
DRAPE SURG 17X23 STRL (DRAPES) ×4 IMPLANT
DRSG XEROFORM 1X8 (GAUZE/BANDAGES/DRESSINGS) ×2 IMPLANT
ELECT REM PT RETURN 9FT ADLT (ELECTROSURGICAL) ×4
ELECTRODE REM PT RTRN 9FT ADLT (ELECTROSURGICAL) ×2 IMPLANT
GAUZE SPONGE 4X4 12PLY STRL (GAUZE/BANDAGES/DRESSINGS) ×4 IMPLANT
GAUZE XEROFORM 1X8 LF (GAUZE/BANDAGES/DRESSINGS) ×4 IMPLANT
GLOVE BIOGEL PI IND STRL 8 (GLOVE) ×2 IMPLANT
GLOVE BIOGEL PI INDICATOR 8 (GLOVE) ×2
GLOVE ECLIPSE 8.0 STRL XLNG CF (GLOVE) ×8 IMPLANT
GOWN STRL REUS W/ TWL LRG LVL3 (GOWN DISPOSABLE) ×4 IMPLANT
GOWN STRL REUS W/ TWL XL LVL3 (GOWN DISPOSABLE) ×4 IMPLANT
GOWN STRL REUS W/TWL LRG LVL3 (GOWN DISPOSABLE) ×8
GOWN STRL REUS W/TWL XL LVL3 (GOWN DISPOSABLE) ×8
HALF PIN 5.0X160 (EXFIX) ×4 IMPLANT
K-WIRE 1.6 (WIRE) ×8
K-WIRE FX5X1.6XNS BN SS (WIRE) ×4
KIT BASIN OR (CUSTOM PROCEDURE TRAY) ×4 IMPLANT
KIT TURNOVER KIT B (KITS) ×4 IMPLANT
KWIRE FX5X1.6XNS BN SS (WIRE) IMPLANT
NDL HYPO 25GX1X1/2 BEV (NEEDLE) IMPLANT
NEEDLE HYPO 25GX1X1/2 BEV (NEEDLE) IMPLANT
NS IRRIG 1000ML POUR BTL (IV SOLUTION) ×4 IMPLANT
PACK ORTHO EXTREMITY (CUSTOM PROCEDURE TRAY) ×4 IMPLANT
PAD ARMBOARD 7.5X6 YLW CONV (MISCELLANEOUS) ×8 IMPLANT
PAD CAST 4YDX4 CTTN HI CHSV (CAST SUPPLIES) ×2 IMPLANT
PADDING CAST COTTON 4X4 STRL (CAST SUPPLIES) ×4
PEG LOCKING SMOOTH 2.2X16 (Screw) ×2 IMPLANT
PEG LOCKING SMOOTH 2.2X20 (Screw) ×2 IMPLANT
PEG LOCKING SMOOTH 2.2X22 (Screw) ×4 IMPLANT
PEG LOCKING SMOOTH 2.2X24 (Peg) ×2 IMPLANT
PIN TRANSFIXING 5.0 (EXFIX) ×4 IMPLANT
PLATE NARROW DVR LEFT (Plate) ×2 IMPLANT
POST ANGLED 900 11 (EXFIX) ×4 IMPLANT
SCREW LOCK 14X2.7X 3 LD TPR (Screw) IMPLANT
SCREW LOCKING 2.7X13MM (Screw) ×2 IMPLANT
SCREW LOCKING 2.7X14 (Screw) ×4 IMPLANT
SCREW LOCKING 2.7X15MM (Screw) ×2 IMPLANT
SCREW NONLOCK 2.7X20MM (Screw) ×2 IMPLANT
SOAP 2 % CHG 4 OZ (WOUND CARE) ×4 IMPLANT
SPLINT PLASTER EXTRA FAST 3X15 (CAST SUPPLIES) ×2
SPLINT PLASTER GYPS XFAST 3X15 (CAST SUPPLIES) IMPLANT
SPONGE LAP 4X18 RFD (DISPOSABLE) ×4 IMPLANT
SUT MNCRL AB 3-0 PS2 18 (SUTURE) ×4 IMPLANT
SUT MNCRL AB 4-0 PS2 18 (SUTURE) ×2 IMPLANT
SUT PROLENE 3 0 PS 2 (SUTURE) ×4 IMPLANT
SUT VIC AB 3-0 FS2 27 (SUTURE) ×2 IMPLANT
SYR CONTROL 10ML LL (SYRINGE) IMPLANT
TOWEL GREEN STERILE (TOWEL DISPOSABLE) ×4 IMPLANT
TOWEL GREEN STERILE FF (TOWEL DISPOSABLE) ×4 IMPLANT
TUBE CONNECTING 12'X1/4 (SUCTIONS) ×1
TUBE CONNECTING 12X1/4 (SUCTIONS) ×3 IMPLANT
WATER STERILE IRR 1000ML POUR (IV SOLUTION) ×4 IMPLANT
YANKAUER SUCT BULB TIP NO VENT (SUCTIONS) IMPLANT

## 2019-06-04 NOTE — Consult Note (Signed)
Reason for Consult:Left wrist/ankle fxs Referring Physician: K Monty Goodman is an 54 y.o. female.  HPI: Regina Goodman was putting a leash on her dog when the neighbor's dogs came out and her dog took off after them. Her feet got tangled in the leash and she was knocked to the ground. She had immediate left wrist and ankle pain. She was brought to the ED where x-rays showed fractures of both and orthopedic surgery was consulted. She is RHD.  No past medical history on file.  No past surgical history on file.  Family History  Problem Relation Age of Onset  . Breast cancer Maternal Aunt   . Breast cancer Maternal Grandmother     Social History:  has no history on file for tobacco, alcohol, and drug.  Allergies:  Allergies  Allergen Reactions  . Penicillins Hives and Swelling    Medications: I have reviewed the patient's current medications.  Results for orders placed or performed during the hospital encounter of 06/04/19 (from the past 48 hour(s))  CBC     Status: Abnormal   Collection Time: 06/04/19  1:44 PM  Result Value Ref Range   WBC 9.2 4.0 - 10.5 Regina/uL   RBC 3.96 3.87 - 5.11 MIL/uL   Hemoglobin 11.9 (L) 12.0 - 15.0 g/dL   HCT 34.7 42.5 - 95.6 %   MCV 92.9 80.0 - 100.0 fL   MCH 30.1 26.0 - 34.0 pg   MCHC 32.3 30.0 - 36.0 g/dL   RDW 38.7 56.4 - 33.2 %   Platelets 226 150 - 400 Regina/uL   nRBC 0.0 0.0 - 0.2 %    Comment: Performed at [facility], [address], [place], [place] [address]  Basic metabolic panel     Status: Abnormal   Collection Time: 06/04/19  1:44 PM  Result Value Ref Range   Sodium 138 135 - 145 mmol/L   Potassium 3.8 3.5 - 5.1 mmol/L   Chloride 102 98 - 111 mmol/L   CO2 26 22 - 32 mmol/L   Glucose, Bld 150 (H) 70 - 99 mg/dL    Comment: Glucose reference range applies only to samples taken after fasting for at least 8 hours.   BUN 17 6 - 20 mg/dL   Creatinine, Ser 4.16 0.44 - 1.00 mg/dL   Calcium 8.7 (L) 8.9 - 10.3 mg/dL   GFR calc non Af  Amer >60 >60 mL/min   GFR calc Af Amer >60 >60 mL/min   Anion gap 10 5 - 15    Comment: Performed at [facility], [address] [place], [place] [address]    DG Wrist Complete Left  Result Date: 06/04/2019 CLINICAL DATA:  Larey Seat while walking dog, pain EXAM: LEFT WRIST - COMPLETE 3+ VIEW COMPARISON:  None. FINDINGS: Frontal, oblique, lateral views of the left wrist are obtained. There is a displaced comminuted distal left radial metaphyseal fracture. There is 1 shaft with dorsal displacement of the distal fracture fragment. The radiocarpal joint remains intact. No definite intra-articular extension of the distal radial fracture line. There is diffuse soft tissue edema. IMPRESSION: 1. Comminuted distal left radial metaphyseal fracture with dorsal displacement and angulation. Electronically Signed   By: Sharlet Salina M.D.   On: 06/04/2019 12:26   DG Tibia/Fibula Left  Result Date: 06/04/2019 CLINICAL DATA:  Larey Seat while walking her dog EXAM: LEFT TIBIA AND FIBULA - 2 VIEW COMPARISON:  None. FINDINGS: Frontal and lateral views of the left tibia and fibula  are obtained. There is a comminuted intra-articular spiral fracture of the distal left tibia, involving the distal diaphysis, metaphysis, and tibial plafond. There is dorsal subluxation of the talus relative to the tibial plafond, without frank dislocation. There is an oblique lateral malleolar fracture. Diffuse soft tissue swelling is noted. IMPRESSION: 1. Spiral comminuted distal left tibial fracture extending to the tibiotalar joint. 2. Dorsal subluxation of the talus relative to the tibia, without frank dislocation. 3. Oblique lateral malleolar fracture. Electronically Signed   By: Randa Ngo M.D.   On: 06/04/2019 12:27   DG Ankle Complete Left  Result Date: 06/04/2019 CLINICAL DATA:  [place] while walking her dog EXAM: LEFT ANKLE COMPLETE - 3+ VIEW COMPARISON:  None. FINDINGS: Frontal, oblique, lateral views of the left ankle are obtained.  There is a spiral comminuted intra-articular distal left tibial fracture. There is dorsal subluxation of the talus relative to the tibial plafond. No frank dislocation. There is an angulated and mildly displaced oblique lateral malleolar fracture. Diffuse soft tissue edema. IMPRESSION: 1. Comminuted intra-articular distal left tibial fracture extending to the tibiotalar joint. 2. Dorsal subluxation of the talus relative to the tibia. No frank dislocation. 3. Oblique lateral malleolar fracture. Electronically Signed   By: Randa Ngo M.D.   On: 06/04/2019 12:28    Review of Systems  HENT: Negative for ear discharge, ear pain, hearing loss and tinnitus.   Eyes: Negative for photophobia and pain.  Respiratory: Negative for cough and shortness of breath.   Cardiovascular: Negative for chest pain.  Gastrointestinal: Negative for abdominal pain, nausea and vomiting.  Genitourinary: Negative for dysuria, flank pain, frequency and urgency.  Musculoskeletal: Positive for arthralgias (Left wrist, left ankle). Negative for back pain, myalgias and neck pain.  Neurological: Negative for dizziness and headaches.  Hematological: Does not bruise/bleed easily.  Psychiatric/Behavioral: The patient is not nervous/anxious.    Weight 69.9 kg, last menstrual period 10/14/2013, SpO2 100 %. Physical Exam  Constitutional: She appears well-developed and well-nourished. No distress.  HENT:  Head: Normocephalic and atraumatic.  Eyes: Conjunctivae are normal. Right eye exhibits no discharge. Left eye exhibits no discharge. No scleral icterus.  Cardiovascular: Normal rate and regular rhythm.  Respiratory: Effort normal. No respiratory distress.  Musculoskeletal:     Cervical back: Normal range of motion.     Comments: Left shoulder, elbow, wrist, digits- no skin wounds, wrist severe TTP, deformity, no instability, no blocks to motion  Sens  Ax/R/M/U intact  Mot   Ax/ R/ PIN/ M/ AIN/ U grossly intact  Rad  2+  LLE No traumatic wounds, ecchymosis, or rash  Ankle severe TTP, mod edema  No knee effusion  Knee stable to varus/ valgus and anterior/posterior stress  Sens DPN, SPN, TN intact  Motor EHL, ext, flex, evers 5/5  DP 1+, PT 0, No significant pedal edema  Neurological: She is alert.  Skin: Skin is warm and dry. She is not diaphoretic.  Psychiatric: She has a normal mood and affect. Her behavior is normal.    Assessment/Plan: Left wrist fx -- Plan CR now, then ORIF later this evening by Dr. Griffin Basil. Left ankle fx -- Plan CR now, then ex fix by Dr. Griffin Basil with plans for ORIF once her swelling improves, likely by Dr. Doreatha Martin.    Lisette Abu, PA-C Orthopedic Surgery [phone] 06/04/2019, 3:01 PM

## 2019-06-04 NOTE — Progress Notes (Signed)
Orthopedic Tech Progress Note Patient Details:  Regina Goodman 28-Mar-1966 [id number] PA did a reduction of WRIST and ANKLE. Applied sugartong (fiberglass) and short leg (PLASTER). After we applied the PLASTER about 10 minutes later the RN called stating patient said " splint is burning her", I called PA and let him know what was going on, we came back and we applied Xeroform onto place and added more webril and applied splint back. Ortho Devices Type of Ortho Device: Sugartong splint, Short leg splint Ortho Device/Splint Location: LUE, LLE Ortho Device/Splint Interventions: Application, Ordered   Post Interventions Patient Tolerated: Fair Instructions Provided: Adjustment of device, Care of device   Donald Pore 06/04/2019, 4:01 PM

## 2019-06-04 NOTE — Anesthesia Procedure Notes (Signed)
Procedure Name: Intubation Date/Time: 06/04/2019 6:41 PM Performed by: Adonis Housekeeper, CRNA Pre-anesthesia Checklist: Patient identified, Emergency Drugs available, Suction available and Patient being monitored Patient Re-evaluated:Patient Re-evaluated prior to induction Oxygen Delivery Method: Circle system utilized Preoxygenation: Pre-oxygenation with 100% oxygen Induction Type: IV induction, Rapid sequence and Cricoid Pressure applied Laryngoscope Size: Glidescope and 3 Grade View: Grade I Tube type: Oral Tube size: 7.0 mm Number of attempts: 1 Airway Equipment and Method: Video-laryngoscopy and Rigid stylet Placement Confirmation: ETT inserted through vocal cords under direct vision,  positive ETCO2 and breath sounds checked- equal and bilateral Secured at: 21 cm Tube secured with: Tape Dental Injury: Teeth and Oropharynx as per pre-operative assessment  Difficulty Due To: Difficulty was anticipated and Difficult Airway- due to limited oral opening Comments: Patient is going to the chiropractor for TMJ issues

## 2019-06-04 NOTE — Progress Notes (Signed)
Orthopedic Tech Progress Note Patient Details:  Regina Goodman 25-Jul-1965 [id number]  Ortho Devices Type of Ortho Device: Sling immobilizer Ortho Device/Splint Location: lue Ortho Device/Splint Interventions: Ordered, Application, Adjustment   Post Interventions Patient Tolerated: Well Instructions Provided: Care of device, Adjustment of device   [name] Post 06/04/2019, 10:15 PM

## 2019-06-04 NOTE — Interval H&P Note (Signed)
History and Physical Interval Note:  06/04/2019 6:19 PM  Regina Goodman  has presented today for surgery, with the diagnosis of Left wrist, ankle fxs.  The various methods of treatment have been discussed with the patient and family. After consideration of risks, benefits and other options for treatment, the patient has consented to  Procedure(s): OPEN REDUCTION INTERNAL FIXATION (ORIF) WRIST FRACTURE (Left) EXTERNAL FIXATION LEG (Left) as a surgical intervention.  The patient's history has been reviewed, patient examined, no change in status, stable for surgery.  I have reviewed the patient's chart and labs.  Questions were answered to the patient's satisfaction.     Bjorn Pippin

## 2019-06-04 NOTE — Op Note (Signed)
Orthopaedic Surgery Operative Note (CSN: [id number])  Regina Goodman  09-17-1965 Date of Surgery: 06/04/2019   Diagnoses:  Left distal tibial plafond fracture with dislocation of tibiotalar joint and left comminuted extraarticular distal radius fracture  Procedure: Left tibial uniplanar ex fix placement Left extraarticular comminuted distal radius fracture >3 parts   Operative Finding Successful completion of the planned procedure.  Good bone quality and reduction though posterior comminution will limit our ability to advance earlier than normal on distal radius.  Tibia will require CT and definitive fixation at a later date and we will consult one of our orthopedic traumatologists regarding this.   Post-operative plan: The patient will be NWB L hand but WBAT L elbow, NWB LLE.  The patient will be readmitted to floor.  DVT prophylaxis Lovenox 40 mg/day until mobilizing and then consider transition in clinic to alternative medicines.   Pain control with PRN pain medication preferring oral medicines.  Follow up plan will be scheduled in approximately 7 days for incision check and XR.  Post-Op Diagnosis: Same Surgeons:Primary: Bjorn Pippin, MD Assistants:Caroline McBane PA-C Location: [facility] OR ROOM 07 Anesthesia: General with local anesthesia Antibiotics: Ancef 2 g with local vancomycin powder 1 g at the surgical site Tourniquet time:  Total Tourniquet Time Documented: Upper Arm (Left) - 24 minutes Total: Upper Arm (Left) - 24 minutes Estimated Blood Loss: Minimal Complications: None Specimens: None Implants: Implant Name Type Inv. Item Serial No. Manufacturer Lot No. LRB No. Used Action  PLATE NARROW DVR LEFT - [id number] Plate PLATE NARROW DVR LEFT  [employer](ORTH,TRAU,BIO,SG)   1 Implanted  SCREW NONLOCK 2.7X20MM - [id number] Screw SCREW NONLOCK 2.7X20MM  [employer](ORTH,TRAU,BIO,SG)   1 Implanted  PEG LOCKING SMOOTH 2.2X22 - [id number] Screw PEG LOCKING SMOOTH 2.2X22  [employer]IMMER  RECON(ORTH,TRAU,BIO,SG)   2 Implanted  PEG LOCKING SMOOTH 2.2X24 - [id number] Peg PEG LOCKING SMOOTH 2.2X24  [employer](ORTH,TRAU,BIO,SG)   1 Implanted  PEG LOCKING SMOOTH 2.2X20 - [id number] Screw PEG LOCKING SMOOTH 2.2X20  [employer]ON(ORTH,TRAU,BIO,SG)   1 Implanted  PEG LOCKING SMOOTH 2.2X16 - [id number] Screw PEG LOCKING SMOOTH 2.2X16  [employer]ON(ORTH,TRAU,BIO,SG)   1 Implanted  SCREW LOCKING 2.7X15MM - [id number] Screw SCREW LOCKING 2.7X15MM  [employer](ORTH,TRAU,BIO,SG)   1 Implanted  SCREW LOCKING 2.7X14 - [id number] Screw SCREW LOCKING 2.7X14  [employer]MER [employer]ON(ORTH,TRAU,BIO,SG)   1 Implanted  SCREW LOCKING 2.7X13MM - [id number] Screw SCREW LOCKING 2.7X13MM  [employer]MER [employer]ON(ORTH,TRAU,BIO,SG)   1 Implanted    Indications for Surgery:   Regina Goodman is a 54 y.o. female with fall resulting in significant fractures including significantly displaced left distal radius and tibia fractures.  We talked about definitive fixation of the radius and  Temporizing fixation of the tibia due to her swelling and complex injury.  Benefits and risks of operative and nonoperative management were discussed prior to surgery with patient/guardian(s) and informed consent form was completed.  Specific risks including infection, need for additional surgery, perioperative NV damage, fracture, CRPS amongst other things.   Procedure:   The patient was identified properly. Informed consent was obtained and the surgical site was marked. The patient was taken up to suite where general anesthesia was induced.  The patient was positioned supine on regular bed.  The left ankle and wrist were prepped and draped in the usual sterile fashion.  Timeout was performed before the beginning of the case.  Tourniquet was used for the above duration.  An FCR approach was made exposing the volar surface of the distal radius taking care  to go through the sheath of the FCR tendon tract and ulnarly exposing the inferior portion of the sheath  while protecting the median nerve and radial artery on each side with blunt retractors.  This inferior portion of the sheath was incised sharply and examined for presence of the palmar cutaneous branch of the median nerve.  It was determined to not be within the field and we carried our dissection deeply to the bone splitting the pronator quadratus and exposing the fracture site.    Appropriate reduction was obtained and a narrow DVR Biomet plate was placed and checked for sizing and reduction under fluoroscopy.  This reduction was held in place with K wires and a K wire was placed into the radial styloid.    Once appropriate reduction was confirmed we then proceeded to fix the plate proximally and then proceeded to fill the distal holes with a combination of partially threaded screws and pegs.  At this point we checked our reduction to ensure that there was no intra-articular extension of our screws.  Once this was confirmed we proceeded to fill remaining 3 proximal shaft screws and obtained final images which demonstrated appropriate reduction and maintenance of alignment.  The DRUJ was checked and found to be stable.  We verified that all fast guides were removed on XR and through count.    The wound was thoroughly irrigated.  The tourniquet was released prior to skin closure to verify there was no excessive bleeding and we visualized that the radial artery and median nerve were intact at the end of the case. The PQ was reapproximated grossly prior to skin closure.    We irrigated the wound copiously before placing local antibiotic as listed above.  Close the incision in a multilayer fashion with absorbable suture.  Sterile dressing was placed.    We turned our attention to the distal tibia.  We began by using fluoroscopy to confirm that we were able to close reduce the ankle.  This point a delta frame was created with 2 half pins in the tibia achieving bicortical purchase.  We verified our position  on fluoroscopy.  We then proceeded to the ankle and identified with fluoroscopy appropriate sites for 2 transfixion pins to control dorsiflexion plantarflexion of the foot as well as our reduction.  These were placed without issue in a percutaneous fashion using a hemostat to spread down to bone to avoid damage to surrounding neurovascular structures.  We also performed these pins placement with a sleeve in place to protect the soft tissues.  Now that all pins were adequately positioned we used fluoroscopy to guide our reduction and created a delta frame with a posterior kickstand to keep the foot off the bed.  Final fluoroscopic images demonstrated an appropriate reduction of the tibiotalar joint.  Dressings were placed on ex fix sites in sterile fashion.  Demi-splint was placed on wrist.  Patient was awoken taken to PACU in stable condition.  Noemi Chapel, PA-C, present and scrubbed throughout the case, critical for completion in a timely fashion, and for retraction, instrumentation, closure.

## 2019-06-04 NOTE — ED Notes (Signed)
Attempted report, call back number given.

## 2019-06-04 NOTE — Anesthesia Postprocedure Evaluation (Signed)
Anesthesia Post Note  Patient: Regina Goodman  Procedure(s) Performed: OPEN REDUCTION INTERNAL FIXATION (ORIF) WRIST FRACTURE (Left Wrist) EXTERNAL FIXATION LEG (Left )     Patient location during evaluation: PACU Anesthesia Type: General Level of consciousness: sedated, patient cooperative and oriented Pain management: pain level controlled Vital Signs Assessment: post-procedure vital signs reviewed and stable Respiratory status: spontaneous breathing, nonlabored ventilation and respiratory function stable Cardiovascular status: blood pressure returned to baseline and stable Postop Assessment: no apparent nausea or vomiting Anesthetic complications: no    Last Vitals:  Vitals:   06/04/19 2036 06/04/19 2045  BP:  117/64  Pulse:  81  Resp:  12  Temp:    SpO2: (!) 89% 97%    Last Pain:  Vitals:   06/04/19 2045  TempSrc:   PainSc: 3                  Donella Pascarella,E. Tryone Kille

## 2019-06-04 NOTE — Transfer of Care (Signed)
Immediate Anesthesia Transfer of Care Note  Patient: Regina Goodman  Procedure(s) Performed: OPEN REDUCTION INTERNAL FIXATION (ORIF) WRIST FRACTURE (Left Wrist) EXTERNAL FIXATION LEG (Left )  Patient Location: PACU  Anesthesia Type:General  Level of Consciousness: awake, alert , oriented and patient cooperative  Airway & Oxygen Therapy: Patient Spontanous Breathing and Patient connected to nasal cannula oxygen  Post-op Assessment: Report given to RN and Post -op Vital signs reviewed and stable  Post vital signs: Reviewed and stable  Last Vitals:  Vitals Value Taken Time  BP 103/57 06/04/19 2004  Temp 36.4 C 06/04/19 2002  Pulse 99 06/04/19 2010  Resp 14 06/04/19 2010  SpO2 99 % 06/04/19 2010  Vitals shown include unvalidated device data.  Last Pain:  Vitals:   06/04/19 2002  TempSrc:   PainSc: (P) 0-No pain         Complications: No apparent anesthesia complications

## 2019-06-04 NOTE — H&P (View-Only) (Signed)
Reason for Consult:Left wrist/ankle fxs Referring Physician: K Monty Goodman is an 54 y.o. female.  HPI: Regina Goodman was putting a leash on her dog when the neighbor's dogs came out and her dog took off after them. Her feet got tangled in the leash and she was knocked to the ground. She had immediate left wrist and ankle pain. She was brought to the ED where x-rays showed fractures of both and orthopedic surgery was consulted. She is RHD.  No past medical history on file.  No past surgical history on file.  Family History  Problem Relation Age of Onset  . Breast cancer Maternal Aunt   . Breast cancer Maternal Grandmother     Social History:  has no history on file for tobacco, alcohol, and drug.  Allergies:  Allergies  Allergen Reactions  . Penicillins Hives and Swelling    Medications: I have reviewed the patient's current medications.  Results for orders placed or performed during the hospital encounter of 06/04/19 (from the past 48 hour(s))  CBC     Status: Abnormal   Collection Time: 06/04/19  1:44 PM  Result Value Ref Range   WBC 9.2 4.0 - 10.5 Regina/uL   RBC 3.96 3.87 - 5.11 MIL/uL   Hemoglobin 11.9 (L) 12.0 - 15.0 g/dL   HCT 34.7 42.5 - 95.6 %   MCV 92.9 80.0 - 100.0 fL   MCH 30.1 26.0 - 34.0 pg   MCHC 32.3 30.0 - 36.0 g/dL   RDW 38.7 56.4 - 33.2 %   Platelets 226 150 - 400 Regina/uL   nRBC 0.0 0.0 - 0.2 %    Comment: Performed at [facility], [address], [place], [place] [address]  Basic metabolic panel     Status: Abnormal   Collection Time: 06/04/19  1:44 PM  Result Value Ref Range   Sodium 138 135 - 145 mmol/L   Potassium 3.8 3.5 - 5.1 mmol/L   Chloride 102 98 - 111 mmol/L   CO2 26 22 - 32 mmol/L   Glucose, Bld 150 (H) 70 - 99 mg/dL    Comment: Glucose reference range applies only to samples taken after fasting for at least 8 hours.   BUN 17 6 - 20 mg/dL   Creatinine, Ser 4.16 0.44 - 1.00 mg/dL   Calcium 8.7 (L) 8.9 - 10.3 mg/dL   GFR calc non Af  Amer >60 >60 mL/min   GFR calc Af Amer >60 >60 mL/min   Anion gap 10 5 - 15    Comment: Performed at [facility], [address] [place], [place] [address]    DG Wrist Complete Left  Result Date: 06/04/2019 CLINICAL DATA:  Larey Seat while walking dog, pain EXAM: LEFT WRIST - COMPLETE 3+ VIEW COMPARISON:  None. FINDINGS: Frontal, oblique, lateral views of the left wrist are obtained. There is a displaced comminuted distal left radial metaphyseal fracture. There is 1 shaft with dorsal displacement of the distal fracture fragment. The radiocarpal joint remains intact. No definite intra-articular extension of the distal radial fracture line. There is diffuse soft tissue edema. IMPRESSION: 1. Comminuted distal left radial metaphyseal fracture with dorsal displacement and angulation. Electronically Signed   By: Sharlet Salina M.D.   On: 06/04/2019 12:26   DG Tibia/Fibula Left  Result Date: 06/04/2019 CLINICAL DATA:  Larey Seat while walking her dog EXAM: LEFT TIBIA AND FIBULA - 2 VIEW COMPARISON:  None. FINDINGS: Frontal and lateral views of the left tibia and fibula  are obtained. There is a comminuted intra-articular spiral fracture of the distal left tibia, involving the distal diaphysis, metaphysis, and tibial plafond. There is dorsal subluxation of the talus relative to the tibial plafond, without frank dislocation. There is an oblique lateral malleolar fracture. Diffuse soft tissue swelling is noted. IMPRESSION: 1. Spiral comminuted distal left tibial fracture extending to the tibiotalar joint. 2. Dorsal subluxation of the talus relative to the tibia, without frank dislocation. 3. Oblique lateral malleolar fracture. Electronically Signed   By: Mccall Will  Brown M.D.   On: 06/04/2019 12:27   DG Ankle Complete Left  Result Date: 06/04/2019 CLINICAL DATA:  Fell while walking her dog EXAM: LEFT ANKLE COMPLETE - 3+ VIEW COMPARISON:  None. FINDINGS: Frontal, oblique, lateral views of the left ankle are obtained.  There is a spiral comminuted intra-articular distal left tibial fracture. There is dorsal subluxation of the talus relative to the tibial plafond. No frank dislocation. There is an angulated and mildly displaced oblique lateral malleolar fracture. Diffuse soft tissue edema. IMPRESSION: 1. Comminuted intra-articular distal left tibial fracture extending to the tibiotalar joint. 2. Dorsal subluxation of the talus relative to the tibia. No frank dislocation. 3. Oblique lateral malleolar fracture. Electronically Signed   By: Genavive Kubicki  Brown M.D.   On: 06/04/2019 12:28    Review of Systems  HENT: Negative for ear discharge, ear pain, hearing loss and tinnitus.   Eyes: Negative for photophobia and pain.  Respiratory: Negative for cough and shortness of breath.   Cardiovascular: Negative for chest pain.  Gastrointestinal: Negative for abdominal pain, nausea and vomiting.  Genitourinary: Negative for dysuria, flank pain, frequency and urgency.  Musculoskeletal: Positive for arthralgias (Left wrist, left ankle). Negative for back pain, myalgias and neck pain.  Neurological: Negative for dizziness and headaches.  Hematological: Does not bruise/bleed easily.  Psychiatric/Behavioral: The patient is not nervous/anxious.    Weight 69.9 kg, last menstrual period 10/14/2013, SpO2 100 %. Physical Exam  Constitutional: She appears well-developed and well-nourished. No distress.  HENT:  Head: Normocephalic and atraumatic.  Eyes: Conjunctivae are normal. Right eye exhibits no discharge. Left eye exhibits no discharge. No scleral icterus.  Cardiovascular: Normal rate and regular rhythm.  Respiratory: Effort normal. No respiratory distress.  Musculoskeletal:     Cervical back: Normal range of motion.     Comments: Left shoulder, elbow, wrist, digits- no skin wounds, wrist severe TTP, deformity, no instability, no blocks to motion  Sens  Ax/R/M/U intact  Mot   Ax/ R/ PIN/ M/ AIN/ U grossly intact  Rad  2+  LLE No traumatic wounds, ecchymosis, or rash  Ankle severe TTP, mod edema  No knee effusion  Knee stable to varus/ valgus and anterior/posterior stress  Sens DPN, SPN, TN intact  Motor EHL, ext, flex, evers 5/5  DP 1+, PT 0, No significant pedal edema  Neurological: She is alert.  Skin: Skin is warm and dry. She is not diaphoretic.  Psychiatric: She has a normal mood and affect. Her behavior is normal.    Assessment/Plan: Left wrist fx -- Plan CR now, then ORIF later this evening by Dr. Varkey. Left ankle fx -- Plan CR now, then ex fix by Dr. Varkey with plans for ORIF once her swelling improves, likely by Dr. Haddix.    Kc Summerson J. Nara Paternoster, PA-C Orthopedic Surgery [phone] 06/04/2019, 3:01 PM  

## 2019-06-04 NOTE — Anesthesia Preprocedure Evaluation (Addendum)
Anesthesia Evaluation  Patient identified by MRN, date of birth, ID band Patient awake    Reviewed: Allergy & Precautions, H&P , NPO status , Patient's Chart, lab work & pertinent test results  History of Anesthesia Complications Negative for: history of anesthetic complications  Airway Mallampati: III  TM Distance: >3 FB Neck ROM: Full  Mouth opening: Limited Mouth Opening  Dental no notable dental hx. (+) Dental Advisory Given, Teeth Intact   Pulmonary neg pulmonary ROS,  06/04/2019 SARS coronavirus NEG   Pulmonary exam normal breath sounds clear to auscultation       Cardiovascular (-) anginanegative cardio ROS   Rhythm:Regular Rate:Normal     Neuro/Psych negative neurological ROS  negative psych ROS   GI/Hepatic negative GI ROS, Neg liver ROS,   Endo/Other  negative endocrine ROS  Renal/GU negative Renal ROS  negative genitourinary   Musculoskeletal   Abdominal   Peds  Hematology negative hematology ROS (+)   Anesthesia Other Findings   Reproductive/Obstetrics negative OB ROS                            Anesthesia Physical Anesthesia Plan  ASA: I  Anesthesia Plan: General   Post-op Pain Management:    Induction: Intravenous  PONV Risk Score and Plan: 4 or greater and Ondansetron, Dexamethasone, Midazolam and Scopolamine patch - Pre-op  Airway Management Planned: Oral ETT and Video Laryngoscope Planned  Additional Equipment:   Intra-op Plan:   Post-operative Plan: Extubation in OR  Informed Consent: I have reviewed the patients History and Physical, chart, labs and discussed the procedure including the risks, benefits and alternatives for the proposed anesthesia with the patient or authorized representative who has indicated his/her understanding and acceptance.     Dental advisory given  Plan Discussed with: CRNA and Surgeon  Anesthesia Plan Comments:        Anesthesia Quick Evaluation

## 2019-06-04 NOTE — Discharge Planning (Signed)
Spouse arrived at ED for update on pt status.  RNCM provided information card with ED phone number to contact RN if he has not received update in 2 hours.  Sammy [phone]

## 2019-06-04 NOTE — ED Provider Notes (Addendum)
[facility] EMERGENCY DEPARTMENT Provider Note   CSN: [id number] Arrival date & time: 06/04/19  1111     History Chief Complaint  Patient presents with  . Fall    Regina Goodman is a 54 y.o. female.  Patient presents via EMS s/p mechanical fall c/o left wrist and left lower leg and ankle pain. Patient indicates was trying to hook her daughter's dog up to dog run, when dog bolted and got her wrapped up in rope. Patient indicates tripped and fell onto left wrist and lower leg. No loc. Called for help, did not try to get up or stand. Symptoms/pain acute onset post fall, mod-severe, dull, constant, non radiating, worse w movement. No numbness or weakness. Denies other pain or injury. No headache. No neck or back pain. No cp or sob. No abd pain or nv. Skin intact.   The history is provided by the patient and the EMS personnel.  Fall Pertinent negatives include no chest pain, no abdominal pain, no headaches and no shortness of breath.       No past medical history on file.  There are no problems to display for this patient.   No past surgical history on file.   OB History   No obstetric history on file.     Family History  Problem Relation Age of Onset  . Breast cancer Maternal Aunt   . Breast cancer Maternal Grandmother     Social History   Tobacco Use  . Smoking status: Not on file  Substance Use Topics  . Alcohol use: Not on file  . Drug use: Not on file    Home Medications Prior to Admission medications   Not on File    Allergies    Patient has no allergy information on record.  Review of Systems   Review of Systems  Constitutional: Negative for fever.  HENT: Negative for nosebleeds.   Eyes: Negative for pain.  Respiratory: Negative for shortness of breath.   Cardiovascular: Negative for chest pain.  Gastrointestinal: Negative for abdominal pain, nausea and vomiting.  Genitourinary: Negative for flank pain.  Musculoskeletal: Negative  for back pain and neck pain.  Skin: Negative for wound.  Neurological: Negative for numbness and headaches.  Hematological: Does not bruise/bleed easily.  Psychiatric/Behavioral: Negative for confusion.    Physical Exam Updated Vital Signs LMP 10/14/2013   Physical Exam Vitals and nursing note reviewed.  Constitutional:      Appearance: Normal appearance. She is well-developed.  HENT:     Head: Atraumatic.     Nose: Nose normal.     Mouth/Throat:     Mouth: Mucous membranes are moist.  Eyes:     General: No scleral icterus.    Conjunctiva/sclera: Conjunctivae normal.     Pupils: Pupils are equal, round, and reactive to light.  Neck:     Trachea: No tracheal deviation.  Cardiovascular:     Rate and Rhythm: Normal rate and regular rhythm.     Pulses: Normal pulses.     Heart sounds: Normal heart sounds. No murmur. No friction rub. No gallop.   Pulmonary:     Effort: Pulmonary effort is normal. No respiratory distress.     Breath sounds: Normal breath sounds.  Chest:     Chest wall: No tenderness.  Abdominal:     General: Bowel sounds are normal. There is no distension.     Palpations: Abdomen is soft.     Tenderness: There is no abdominal tenderness. There  is no guarding.     Comments: No abd bruising or contusion.   Genitourinary:    Comments: No cva tenderness.  Musculoskeletal:     Cervical back: Normal range of motion and neck supple. No rigidity or tenderness. No muscular tenderness.     Comments: Deformity to left wrist. Radial pulse 2+. STS and tenderness distal left tibia and med/lat malleolus left ankle. Dp/pt 2+. Skin intact. No other focal bony tenderness on bil extremity exam.  CTLS spine, non tender, aligned, no step off.   Skin:    General: Skin is warm and dry.     Findings: No rash.  Neurological:     Mental Status: She is alert.     Comments: Alert, speech normal. GCS 15. LUE/hand, motor/sens rad/med/uln intact. LLE/foot able to move all toes, sens  grossly intact. No focal deficit noted on exam.   Psychiatric:        Mood and Affect: Mood normal.     ED Results / Procedures / Treatments   Labs (all labs ordered are listed, but only abnormal results are displayed) Results for orders placed or performed during the hospital encounter of 06/04/19  CBC  Result Value Ref Range   WBC 9.2 4.0 - 10.5 K/uL   RBC 3.96 3.87 - 5.11 MIL/uL   Hemoglobin 11.9 (L) 12.0 - 15.0 g/dL   HCT 61.4 43.1 - 54.0 %   MCV 92.9 80.0 - 100.0 fL   MCH 30.1 26.0 - 34.0 pg   MCHC 32.3 30.0 - 36.0 g/dL   RDW 08.6 76.1 - 95.0 %   Platelets 226 150 - 400 K/uL   nRBC 0.0 0.0 - 0.2 %  Basic metabolic panel  Result Value Ref Range   Sodium 138 135 - 145 mmol/L   Potassium 3.8 3.5 - 5.1 mmol/L   Chloride 102 98 - 111 mmol/L   CO2 26 22 - 32 mmol/L   Glucose, Bld 150 (H) 70 - 99 mg/dL   BUN 17 6 - 20 mg/dL   Creatinine, Ser 9.32 0.44 - 1.00 mg/dL   Calcium 8.7 (L) 8.9 - 10.3 mg/dL   GFR calc non Af Amer >60 >60 mL/min   GFR calc Af Amer >60 >60 mL/min   Anion gap 10 5 - 15   DG Wrist Complete Left  Result Date: 06/04/2019 CLINICAL DATA:  Larey Seat while walking dog, pain EXAM: LEFT WRIST - COMPLETE 3+ VIEW COMPARISON:  None. FINDINGS: Frontal, oblique, lateral views of the left wrist are obtained. There is a displaced comminuted distal left radial metaphyseal fracture. There is 1 shaft with dorsal displacement of the distal fracture fragment. The radiocarpal joint remains intact. No definite intra-articular extension of the distal radial fracture line. There is diffuse soft tissue edema. IMPRESSION: 1. Comminuted distal left radial metaphyseal fracture with dorsal displacement and angulation. Electronically Signed   By: Sharlet Salina M.D.   On: 06/04/2019 12:26   DG Tibia/Fibula Left  Result Date: 06/04/2019 CLINICAL DATA:  Larey Seat while walking her dog EXAM: LEFT TIBIA AND FIBULA - 2 VIEW COMPARISON:  None. FINDINGS: Frontal and lateral views of the left tibia and  fibula are obtained. There is a comminuted intra-articular spiral fracture of the distal left tibia, involving the distal diaphysis, metaphysis, and tibial plafond. There is dorsal subluxation of the talus relative to the tibial plafond, without frank dislocation. There is an oblique lateral malleolar fracture. Diffuse soft tissue swelling is noted. IMPRESSION: 1. Spiral comminuted distal left tibial fracture  extending to the tibiotalar joint. 2. Dorsal subluxation of the talus relative to the tibia, without frank dislocation. 3. Oblique lateral malleolar fracture. Electronically Signed   By: Sharlet SalinaMichael  Brown M.D.   On: 06/04/2019 12:27   DG Ankle Complete Left  Result Date: 06/04/2019 CLINICAL DATA:  Larey SeatFell while walking her dog EXAM: LEFT ANKLE COMPLETE - 3+ VIEW COMPARISON:  None. FINDINGS: Frontal, oblique, lateral views of the left ankle are obtained. There is a spiral comminuted intra-articular distal left tibial fracture. There is dorsal subluxation of the talus relative to the tibial plafond. No frank dislocation. There is an angulated and mildly displaced oblique lateral malleolar fracture. Diffuse soft tissue edema. IMPRESSION: 1. Comminuted intra-articular distal left tibial fracture extending to the tibiotalar joint. 2. Dorsal subluxation of the talus relative to the tibia. No frank dislocation. 3. Oblique lateral malleolar fracture. Electronically Signed   By: Sharlet SalinaMichael  Brown M.D.   On: 06/04/2019 12:28    EKG None  Radiology DG Wrist Complete Left  Result Date: 06/04/2019 CLINICAL DATA:  Larey SeatFell while walking dog, pain EXAM: LEFT WRIST - COMPLETE 3+ VIEW COMPARISON:  None. FINDINGS: Frontal, oblique, lateral views of the left wrist are obtained. There is a displaced comminuted distal left radial metaphyseal fracture. There is 1 shaft with dorsal displacement of the distal fracture fragment. The radiocarpal joint remains intact. No definite intra-articular extension of the distal radial fracture  line. There is diffuse soft tissue edema. IMPRESSION: 1. Comminuted distal left radial metaphyseal fracture with dorsal displacement and angulation. Electronically Signed   By: Sharlet SalinaMichael  Brown M.D.   On: 06/04/2019 12:26   DG Tibia/Fibula Left  Result Date: 06/04/2019 CLINICAL DATA:  Larey SeatFell while walking her dog EXAM: LEFT TIBIA AND FIBULA - 2 VIEW COMPARISON:  None. FINDINGS: Frontal and lateral views of the left tibia and fibula are obtained. There is a comminuted intra-articular spiral fracture of the distal left tibia, involving the distal diaphysis, metaphysis, and tibial plafond. There is dorsal subluxation of the talus relative to the tibial plafond, without frank dislocation. There is an oblique lateral malleolar fracture. Diffuse soft tissue swelling is noted. IMPRESSION: 1. Spiral comminuted distal left tibial fracture extending to the tibiotalar joint. 2. Dorsal subluxation of the talus relative to the tibia, without frank dislocation. 3. Oblique lateral malleolar fracture. Electronically Signed   By: Sharlet SalinaMichael  Brown M.D.   On: 06/04/2019 12:27   DG Ankle Complete Left  Result Date: 06/04/2019 CLINICAL DATA:  Larey SeatFell while walking her dog EXAM: LEFT ANKLE COMPLETE - 3+ VIEW COMPARISON:  None. FINDINGS: Frontal, oblique, lateral views of the left ankle are obtained. There is a spiral comminuted intra-articular distal left tibial fracture. There is dorsal subluxation of the talus relative to the tibial plafond. No frank dislocation. There is an angulated and mildly displaced oblique lateral malleolar fracture. Diffuse soft tissue edema. IMPRESSION: 1. Comminuted intra-articular distal left tibial fracture extending to the tibiotalar joint. 2. Dorsal subluxation of the talus relative to the tibia. No frank dislocation. 3. Oblique lateral malleolar fracture. Electronically Signed   By: Sharlet SalinaMichael  Brown M.D.   On: 06/04/2019 12:28    Procedures .Sedation  Date/Time: 06/04/2019 3:23 PM Performed by: Cathren LaineSteinl,  Emit Kuenzel, MD Authorized by: Cathren LaineSteinl, Finlee Concepcion, MD   Consent:    Consent obtained:  Verbal   Consent given by:  Patient   Risks discussed:  Allergic reaction, dysrhythmia, inadequate sedation, nausea, prolonged hypoxia resulting in organ damage, prolonged sedation necessitating reversal, respiratory compromise necessitating ventilatory assistance and intubation  and vomiting   Alternatives discussed:  Analgesia without sedation, anxiolysis and regional anesthesia Universal protocol:    Procedure explained and questions answered to patient or proxy's satisfaction: yes     Relevant documents present and verified: yes     Test results available and properly labeled: yes     Imaging studies available: yes     Required blood products, implants, devices, and special equipment available: yes     Site/side marked: yes     Immediately prior to procedure a time out was called: yes     Patient identity confirmation method:  Verbally with patient, arm band, provided demographic data and hospital-assigned identification number Indications:    Procedure performed:  Fracture reduction (fracture/subluxation reduction)   Procedure necessitating sedation performed by:  Different physician Pre-sedation assessment:    Time since last food or drink:  3 hrs   ASA classification: class 1 - normal, healthy patient     Neck mobility: normal     Mouth opening:  3 or more finger widths   Thyromental distance:  4 finger widths   Mallampati score:  I - soft palate, uvula, fauces, pillars visible   Pre-sedation assessments completed and reviewed: airway patency, cardiovascular function, hydration status, mental status, nausea/vomiting, pain level, respiratory function and temperature     Pre-sedation assessment completed:  06/04/2019 2:45 PM Immediate pre-procedure details:    Reassessment: Patient reassessed immediately prior to procedure     Reviewed: vital signs, relevant labs/tests and NPO status     Verified: bag valve  mask available, emergency equipment available, intubation equipment available, IV patency confirmed, oxygen available and suction available   Procedure details (see MAR for exact dosages):    Preoxygenation:  Nasal cannula   Sedation:  Propofol   Intended level of sedation: deep   Intra-procedure monitoring:  Blood pressure monitoring, cardiac monitor, continuous pulse oximetry, frequent LOC assessments, frequent vital sign checks and continuous capnometry   Intra-procedure events: none     Total Provider sedation time (minutes):  25 Post-procedure details:    Post-sedation assessment completed:  06/04/2019 3:26 PM   Attendance: Constant attendance by certified staff until patient recovered     Recovery: Patient returned to pre-procedure baseline     Post-sedation assessments completed and reviewed: airway patency, cardiovascular function, hydration status, mental status, nausea/vomiting, pain level, respiratory function and temperature     Patient is stable for discharge or admission: yes     Patient tolerance:  Tolerated well, no immediate complications Comments:     I performed the procedural sedation. Fracture reduction performed by orthopedic service/Michael.   Recheck post sedation/reduction - normal cap refill distally in toes/fingers. Pain controlled. No numbness.  Post reduction xrays ordered by ortho, and pending.    (including critical care time)  Medications Ordered in ED Medications  HYDROmorphone (DILAUDID) injection 1 mg (has no administration in time range)  ondansetron (ZOFRAN) injection 4 mg (has no administration in time range)    ED Course  I have reviewed the triage vital signs and the nursing notes.  Pertinent labs & imaging results that were available during my care of the patient were reviewed by me and considered in my medical decision making (see chart for details).    MDM Rules/Calculators/A&P                      Iv ns. Imaging studies ordered. Patients  LUE and LLE in splint.   Dilaudid 1 mg iv. zofran  iv.   Reviewed nursing notes and prior charts for additional history.   Recheck pain improved.   Post xrays, recheck, left radial and left dp/pt palp. No numbness. Pain improved.   Xrays reviewed/interpreted by me - comminuted, displaced, intraarticular fractures left distal radius, and left distal tibia, +subluxation talus on tiba.   Orthopedics on call consulted - discussed pt and xrays with [facility]im[facility] Needle - they will come to ED, reduce, and plan to take to OR for definitive repair.   Labs reviewed/interpreted by me - hct 37, normal, lytes normal.   Xrays and plan discussed w pt.   Orthopedics reduced in ED, splints applied, and plans to take to OR tonight around 630 or 7 pm.         Final Clinical Impression(s) / ED Diagnoses Final diagnoses:  None    Rx / DC Orders ED Discharge Orders    None         Cathren Laine, MD 06/04/19 1528

## 2019-06-04 NOTE — ED Triage Notes (Signed)
Pt arrives by EMS with c/o fall. Pt was with her dog when the dog pulled her forward and she fell. Pt has obvious deformity to left wrist and left ankle. Denies LOC, denies neck and back pain. Pt given 100 mcg by EMS.

## 2019-06-04 NOTE — Procedures (Signed)
Procedure: Left wrist and ankle closed reductions  Indication: Left wrist and ankle fractures  Surgeon: Charma Igo, PA-C  Assist: None  Anesthesia: Propofol via CS by EDP  EBL: None  Complications: None  Findings: After risks/benefits explained patient desires to undergo procedure. Consent obtained and time out performed. Sedation given and confirmed effective. Closed reduction maneuvers performed on left wrist and left ankle. Pt tolerated the procedure well. Unsure of ankle reduction but not dissimilar from contralateral side after reduction. Post-reduction x-rays pending.    Freeman Caldron, PA-C Orthopedic Surgery [phone]

## 2019-06-05 ENCOUNTER — Encounter (HOSPITAL_COMMUNITY): Payer: Self-pay | Admitting: Orthopaedic Surgery

## 2019-06-05 LAB — CBC
HCT: 32.2 % — ABNORMAL LOW (ref 36.0–46.0)
Hemoglobin: 10.7 g/dL — ABNORMAL LOW (ref 12.0–15.0)
MCH: 30.4 pg (ref 26.0–34.0)
MCHC: 33.2 g/dL (ref 30.0–36.0)
MCV: 91.5 fL (ref 80.0–100.0)
Platelets: 219 10*3/uL (ref 150–400)
RBC: 3.52 MIL/uL — ABNORMAL LOW (ref 3.87–5.11)
RDW: 13.1 % (ref 11.5–15.5)
WBC: 9.2 10*3/uL (ref 4.0–10.5)
nRBC: 0 % (ref 0.0–0.2)

## 2019-06-05 LAB — BASIC METABOLIC PANEL
Anion gap: 11 (ref 5–15)
BUN: 12 mg/dL (ref 6–20)
CO2: 25 mmol/L (ref 22–32)
Calcium: 8.8 mg/dL — ABNORMAL LOW (ref 8.9–10.3)
Chloride: 101 mmol/L (ref 98–111)
Creatinine, Ser: 0.85 mg/dL (ref 0.44–1.00)
GFR calc Af Amer: >60 mL/min (ref >60)
GFR calc non Af Amer: >60 mL/min (ref >60)
Glucose, Bld: 135 mg/dL — ABNORMAL HIGH (ref 70–99)
Potassium: 3.7 mmol/L (ref 3.5–5.1)
Sodium: 137 mmol/L (ref 135–145)

## 2019-06-05 LAB — HIV ANTIBODY (ROUTINE TESTING W REFLEX): HIV Screen 4th Generation wRfx: NONREACTIVE

## 2019-06-05 NOTE — Progress Notes (Signed)
Rehab Admissions Coordinator Note:  Per PT/OT recommendation, patient was screened by Stephania Fragmin for appropriateness for an Inpatient Acute Rehab Consult.  Note patient scheduled for surgery tomorrow.  At this time, we will await post surgical therapy recommendations before evaluating for CIR candidacy.   Stephania Fragmin 06/05/2019, 1:16 PM  I can be reached at [phone].

## 2019-06-05 NOTE — Plan of Care (Signed)

## 2019-06-05 NOTE — Evaluation (Signed)
Occupational Therapy Evaluation Patient Details Name: Regina Goodman MRN: [id number] DOB: Aug 28, 1965 Today's Date: 06/05/2019    History of Present Illness Regina Goodman is an 54 y.o. female presented to ED after fall resulting in left wrist and ankle fxs. S/p left wrist ORIF and left leg external fixation.    Clinical Impression   PTA, pt was living at home with her husband and adult children, pt was independent with ADL/IADL and functional mobility and is retired. Pt currently requires modA to stand and minA to stand-pivot to recliner with left platform walker. She required minA for donning/doffing sling. Educated pt on importance of elevating LUE and LLE and importance of ROM to digits, elbow, and shoulder. Due to decline in current level of function, pt would benefit from acute OT to address established goals to facilitate safe D/C to venue listed below. At this time, recommend CIR follow-up. Will continue to follow acutely.     Follow Up Recommendations  CIR    Equipment Recommendations  3 in 1 bedside commode    Recommendations for Other Services       Precautions / Restrictions Precautions Precautions: Fall Restrictions Weight Bearing Restrictions: Yes LUE Weight Bearing: Non weight bearing(NWB through hand, WB through elbow only ) LLE Weight Bearing: Non weight bearing Other Position/Activity Restrictions: Per ortho note      Mobility Bed Mobility Overal bed mobility: Needs Assistance Bed Mobility: Supine to Sit     Supine to sit: Min guard;HOB elevated     General bed mobility comments: minguard for safety;pt with use of bed rails, increased time and effort  Transfers Overall transfer level: Needs assistance Equipment used: Left platform walker Transfers: Sit to/from Stand;Stand Pivot Transfers Sit to Stand: Mod assist Stand pivot transfers: Min assist       General transfer comment: modA to powerup into standing, minA for stand pivot with intermittent modA      Balance Overall balance assessment: Needs assistance Sitting-balance support: Single extremity supported;Feet supported Sitting balance-Leahy Scale: Fair     Standing balance support: Bilateral upper extremity supported Standing balance-Leahy Scale: Poor Standing balance comment: reliant on BUE support in standing while adhering to precautions                           ADL either performed or assessed with clinical judgement   ADL Overall ADL's : Needs assistance/impaired Eating/Feeding: Set up;Sitting   Grooming: Set up;Sitting   Upper Body Bathing: Minimal assistance;Sitting   Lower Body Bathing: Moderate assistance;Sit to/from stand   Upper Body Dressing : Minimal assistance;Sitting Upper Body Dressing Details (indicate cue type and reason): minA to don/doff sling Lower Body Dressing: Maximal assistance;Sit to/from stand Lower Body Dressing Details (indicate cue type and reason): maxA for simulated LB dressing Toilet Transfer: Moderate assistance;Stand-pivot Toilet Transfer Details (indicate cue type and reason): simulated to recliner Toileting- Clothing Manipulation and Hygiene: Moderate assistance;Sit to/from stand Toileting - Clothing Manipulation Details (indicate cue type and reason): modA to powerup      Functional mobility during ADLs: Moderate assistance;Rolling walker(platform walker) General ADL Comments: good adherence to precautions with transfer, began education on dressing;educated pt on wear schedule for sling and importance of elevating LUE and LLE and movement as allowed     Vision         Perception     Praxis      Pertinent Vitals/Pain Pain Assessment: 0-10 Pain Score: 4  Pain Location: L ankle Pain Descriptors /  Indicators: Sore Pain Intervention(s): Limited activity within patient's tolerance;Monitored during session;Repositioned     Hand Dominance Right   Extremity/Trunk Assessment Upper Extremity Assessment Upper  Extremity Assessment: LUE deficits/detail LUE Deficits / Details: no reports of numbness/tingling in hand;able to wiggle fingers;no pain with elbow/shoulder ROM;limited assessment due to immobilization;dressing in place;sling  LUE: Unable to fully assess due to immobilization LUE Sensation: WNL LUE Coordination: decreased fine motor;decreased gross motor   Lower Extremity Assessment Lower Extremity Assessment: LLE deficits/detail;Defer to PT evaluation LLE Deficits / Details: hip flexion 4/5;external fixation and dressing in place;reports numbness and tingling in toes   Cervical / Trunk Assessment Cervical / Trunk Assessment: Normal   Communication Communication Communication: No difficulties   Cognition Arousal/Alertness: Awake/alert Behavior During Therapy: WFL for tasks assessed/performed Overall Cognitive Status: Within Functional Limits for tasks assessed                                 General Comments: good adherence to WB precautions   General Comments  vss;Pt on RA upon arrival, SpO2 99% at rest    Exercises     Shoulder Instructions      Home Living Family/patient expects to be discharged to:: Private residence Living Arrangements: Spouse/significant other Available Help at Discharge: Family;Available 24 hours/day Type of Home: House Home Access: Stairs to enter [employer] of Steps: 3 Entrance Stairs-Rails: Left Home Layout: One level     Bathroom Shower/Tub: Tub only;Tub/shower unit   Teacher, early years/pre: Yes How Accessible: Accessible via walker Home Equipment: Wheelchair - manual   Additional Comments: pt's mother recently passed away, she will be able to get her wheelchair if needed      Prior Functioning/Environment Level of Independence: Independent        Comments: pt is retired, enjoys going to [employer] in the morning and spending time with family and friends        OT Problem  List: Decreased strength;Decreased range of motion;Decreased activity tolerance;Impaired balance (sitting and/or standing);Decreased safety awareness;Decreased knowledge of use of DME or AE;Decreased knowledge of precautions;Impaired sensation;Impaired UE functional use;Pain      OT Treatment/Interventions: Self-care/ADL training;Therapeutic exercise;Energy conservation;DME and/or AE instruction;Therapeutic activities;Patient/family education;Balance training    OT Goals(Current goals can be found in the care plan section) Acute Rehab OT Goals Patient Stated Goal: to get stronger before returning home OT Goal Formulation: With patient Time For Goal Achievement: 06/19/19 Potential to Achieve Goals: Good ADL Goals Pt Will Perform Grooming: with modified independence Pt Will Perform Upper Body Dressing: with modified independence;sitting Pt Will Perform Lower Body Dressing: with supervision;sit to/from stand Pt Will Transfer to Toilet: with min assist;ambulating  OT Frequency: Min 2X/week   Barriers to D/C: Inaccessible home environment  pt has 3 steps in front with railing on L side, 5 steps in back no railing       Co-evaluation              AM-PAC OT "6 Clicks" Daily Activity     Outcome Measure Help from another person eating meals?: A Little Help from another person taking care of personal grooming?: A Little Help from another person toileting, which includes using toliet, bedpan, or urinal?: A Lot Help from another person bathing (including washing, rinsing, drying)?: A Little Help from another person to put on and taking off regular upper body clothing?: A Little Help from another person to put on and taking  off regular lower body clothing?: A Lot 6 Click Score: 16   End of Session Equipment Utilized During Treatment: Gait belt;Rolling walker(platform walker;sling) Nurse Communication: Mobility status  Activity Tolerance: Patient tolerated treatment well Patient left:  in chair;with call bell/phone within reach;with nursing/sitter in room  OT Visit Diagnosis: Unsteadiness on feet (R26.81);Other abnormalities of gait and mobility (R26.89);Pain Pain - Right/Left: Left Pain - part of body: Ankle and joints of foot;Arm                Time: 3291-9166 OT Time Calculation (min): 38 min Charges:  OT General Charges $OT Visit: 1 Visit OT Evaluation $OT Eval Moderate Complexity: 1 Mod OT Treatments $Self Care/Home Management : 23-37 mins  Helene Kelp OTR/L Acute Rehabilitation Services Office: [place] 06/05/2019, 10:42 AM

## 2019-06-05 NOTE — H&P (View-Only) (Signed)
Orthopaedic Trauma Service (OTS) Consult   Patient ID: Regina Goodman MRN: [id number] DOB/AGE: 1965-09-08 54 y.o.  Reason for Consult:Left pilon fracture Referring Physician: Dr. Ophelia Charter , MD w/ [facility]  HPI: Regina Goodman is an 54 y.o. female who is being seen in consultation at the request of Dr. Griffin Basil for evaluation of left pilon fracture.  The patient had fallen after getting tangled up in her dog's leash.  She sustained a left distal radius fracture and a left distal tibia fracture with intra-articular extension.  She was taken urgently for open reduction internal fixation of her left distal radius fracture along with external fixation of her left distal tibia fracture.  Due to the complexity of her injury to her left lower extremity Dr. Griffin Basil felt that it was outside the scope of practice and recommended treatment by an orthopedic traumatologist.  Patient was seen at bedside on 5 N. this morning.  She is comfortable without significant pain.  Denies any numbness or tingling.  Denies any injuries to her right lower extremity.  Husband is on the phone and I was able to talk to the both of them during evaluation.  She is a retired Radio producer.  She denies any heart history or diabetes.  She ambulates without assist device.  She denies any tobacco use.  She lives on a single-story home with her husband with a few steps to enter.  As noted above her pain is relatively well controlled.  She denies any significant problems unless she tries to move her leg.  History reviewed. No pertinent past medical history.  History reviewed. No pertinent surgical history.  Family History  Problem Relation Age of Onset  . Breast cancer Maternal Aunt   . Breast cancer Maternal Grandmother     Social History:  has no history on file for tobacco, alcohol, and drug.  Allergies:  Allergies  Allergen Reactions  . Penicillins Hives and Swelling    Medications:  No current facility-administered  medications on file prior to encounter.   Current Outpatient Medications on File Prior to Encounter  Medication Sig Dispense Refill  . sertraline (ZOLOFT) 100 MG tablet Take 100 mg by mouth daily.       ROS: Constitutional: No fever or chills Vision: No changes in vision ENT: No difficulty swallowing CV: No chest pain Pulm: No SOB or wheezing GI: No nausea or vomiting GU: No urgency or inability to hold urine Skin: No poor wound healing Neurologic: No numbness or tingling Psychiatric: No depression or anxiety Heme: No bruising Allergic: No reaction to medications or food   Exam: Blood pressure 101/60, pulse 67, temperature 98.1 F (36.7 C), temperature source Oral, resp. rate 16, height 5\' 6"  (1.676 m), weight 69.9 kg, last menstrual period 10/14/2013, SpO2 98 %. General: No acute distress Orientation: Awake alert and oriented x3 Mood and Affect: Cooperative and pleasant. Gait: Unable to assess due to her fracture Coordination and balance: Within normal limits  Left lower extremity: Exfix is in place pin sites are clean dry and intact.  The patient has moderate swelling but she does skin wrinkle about a planned anterior medial or anterior lateral incision.  She endorses sensation to the dorsum and plantar aspect of her foot.  She has notable tenderness.  No open wounds.  She is active dorsiflexion plantarflexion of her toes.  No obvious deformity or abrasions about her knee.  No pain with gentle logroll maneuver of her left hip.  No lymphadenopathy and reflexes are within normal  limits.  She has a warm well-perfused foot with brisk cap refill.  Right lower extremity: Skin without lesions. No tenderness to palpation. Full painless ROM, full strength in each muscle groups without evidence of instability.  Left upper extremity: Splint and sling is in place.  Compartment soft and compressible.  She has motor and sensory function intact.  She has brisk cap refill.  No pain about the  shoulder.   Medical Decision Making: Data: Imaging: X-rays and CT scan of the left distal tibia are reviewed which shows a spiral oblique distal tibia fracture with intra-articular extension with large posterior malleolus fracture that is a relatively anatomic.  There is a Weber B distal fibula fracture as well.  Ankle mortise is well reduced.  Some mild distraction at the tibiotalar joint.  Labs:  Results for orders placed or performed during the hospital encounter of 06/04/19 (from the past 24 hour(s))  CBC     Status: Abnormal   Collection Time: 06/04/19  1:44 PM  Result Value Ref Range   WBC 9.2 4.0 - 10.5 K/uL   RBC 3.96 3.87 - 5.11 MIL/uL   Hemoglobin 11.9 (L) 12.0 - 15.0 g/dL   HCT 47.0 96.2 - 83.6 %   MCV 92.9 80.0 - 100.0 fL   MCH 30.1 26.0 - 34.0 pg   MCHC 32.3 30.0 - 36.0 g/dL   RDW 62.9 47.6 - 54.6 %   Platelets 226 150 - 400 K/uL   nRBC 0.0 0.0 - 0.2 %  Basic metabolic panel     Status: Abnormal   Collection Time: 06/04/19  1:44 PM  Result Value Ref Range   Sodium 138 135 - 145 mmol/L   Potassium 3.8 3.5 - 5.1 mmol/L   Chloride 102 98 - 111 mmol/L   CO2 26 22 - 32 mmol/L   Glucose, Bld 150 (H) 70 - 99 mg/dL   BUN 17 6 - 20 mg/dL   Creatinine, Ser 5.03 0.44 - 1.00 mg/dL   Calcium 8.7 (L) 8.9 - 10.3 mg/dL   GFR calc non Af Amer >60 >60 mL/min   GFR calc Af Amer >60 >60 mL/min   Anion gap 10 5 - 15  Respiratory Panel by RT PCR (Flu A&B, Covid) - Nasopharyngeal Swab     Status: None   Collection Time: 06/04/19  2:48 PM   Specimen: Nasopharyngeal Swab  Result Value Ref Range   SARS Coronavirus 2 by RT PCR NEGATIVE NEGATIVE   Influenza A by PCR NEGATIVE NEGATIVE   Influenza B by PCR NEGATIVE NEGATIVE  Surgical pcr screen     Status: None   Collection Time: 06/04/19  5:09 PM   Specimen: Nasal Mucosa; Nasal Swab  Result Value Ref Range   MRSA, PCR NEGATIVE NEGATIVE   Staphylococcus aureus NEGATIVE NEGATIVE  CBC     Status: Abnormal   Collection Time: 06/05/19   6:18 AM  Result Value Ref Range   WBC 9.2 4.0 - 10.5 K/uL   RBC 3.52 (L) 3.87 - 5.11 MIL/uL   Hemoglobin 10.7 (L) 12.0 - 15.0 g/dL   HCT 54.6 (L) 56.8 - 12.7 %   MCV 91.5 80.0 - 100.0 fL   MCH 30.4 26.0 - 34.0 pg   MCHC 33.2 30.0 - 36.0 g/dL   RDW 51.7 00.1 - 74.9 %   Platelets 219 150 - 400 K/uL   nRBC 0.0 0.0 - 0.2 %  Basic metabolic panel     Status: Abnormal   Collection Time: 06/05/19  6:18 AM  Result Value Ref Range   Sodium 137 135 - 145 mmol/L   Potassium 3.7 3.5 - 5.1 mmol/L   Chloride 101 98 - 111 mmol/L   CO2 25 22 - 32 mmol/L   Glucose, Bld 135 (H) 70 - 99 mg/dL   BUN 12 6 - 20 mg/dL   Creatinine, Ser 0.17 0.44 - 1.00 mg/dL   Calcium 8.8 (L) 8.9 - 10.3 mg/dL   GFR calc non Af Amer >60 >60 mL/min   GFR calc Af Amer >60 >60 mL/min   Anion gap 11 5 - 15    Imaging or Labs ordered: None new ordered  Medical history and chart was reviewed and case discussed with medical provider.  Assessment/Plan: 54 year old female status post fall with a left distal radius fracture status post open reduction internal fixation and a left distal tibia fracture with intra-articular extension status post external fixation.  Due to the unstable nature of her injury and intra-articular nature of her injury I recommend proceeding with open reduction internal fixation.  I feel that her swelling currently is appropriate to proceed with the surgery potentially tomorrow.  I will tentatively put her on the schedule with plans for n.p.o. after midnight with an evaluation first thing tomorrow morning to assess whether soft tissues could tolerate an incision.  I discussed risks and benefits with the patient and her husband over the phone.  Risks included but not limited to bleeding, infection, malunion, nonunion, hardware failure, hardware irritation, nerve and blood vessel injury, ankle stiffness, posttraumatic arthritis, DVT, even the possibility anesthetic complications.  The patient agreed to proceed  with surgery and consent will be obtained.  Roby Lofts, MD [facility] [phone] (office) [email or web address]

## 2019-06-05 NOTE — Plan of Care (Signed)
  Problem: Pain Managment: Goal: General experience of comfort will improve Outcome: Progressing   Problem: Safety: Goal: Ability to remain free from injury will improve Outcome: Progressing   Problem: Skin Integrity: Goal: Risk for impaired skin integrity will decrease Outcome: Progressing   

## 2019-06-05 NOTE — Evaluation (Signed)
Physical Therapy Evaluation Patient Details Name: Regina Goodman MRN: [id number] DOB: Oct 14, 1965 Today's Date: 06/05/2019   History of Present Illness  Pt is a 54 y.o. female presenting to the ED after sustaining a fall, resulting in left wrist and ankle fxs. Pt is now s/p left wrist ORIF and left LE external fixation. No pertinent PMH.    Clinical Impression  Pt presented sitting upright in recliner chair, awake and willing to participate in therapy session. Pt's husband present throughout session as well. Prior to admission, pt reported that she was independent with all functional mobility and ADLs. Pt lives with her husband in a single level house with three steps to enter. At the time of evaluation, pt able to perform bed mobility with min guard and transfers with min A using a L PFRW. Per pt, plan is for her to return to the OR tomorrow for removal of external fixator with internal fixation. Anticipate that she will progress nicely after her second surgery. PT will continue to follow pt acutely to progress mobility as tolerated. She would benefit from further intensive therapy services at [facility] to maximize her independence with functional mobility prior to returning home with family support.     Follow Up Recommendations [facility]    Equipment Recommendations  Rolling walker with 5" wheels;3in1 (PT);Wheelchair cushion (measurements PT);Wheelchair (measurements PT);Other (comment)(Platform attachment for RW; w/c with elevating leg rests)    Recommendations for Other Services       Precautions / Restrictions Precautions Precautions: Fall Precaution Comments: external fixator L LE Restrictions Weight Bearing Restrictions: Yes LUE Weight Bearing: Weight bear through elbow only LLE Weight Bearing: Non weight bearing Other Position/Activity Restrictions: Per ortho note      Mobility  Bed Mobility Overal bed mobility: Needs Assistance Bed Mobility: Sit to Supine     Supine to sit: Min  guard;HOB elevated Sit to supine: Min guard   General bed mobility comments: min guard for safety; no physical assistance needed  Transfers Overall transfer level: Needs assistance Equipment used: Left platform walker Transfers: Sit to/from Stand;Stand Pivot Transfers Sit to Stand: Min assist Stand pivot transfers: Min assist       General transfer comment: pt performed sit<>stand x1 from recliner chair and x1 from [facility]; pt able to pivot and progressing to taking 1-2 hops in place on R LE with use of L PFRW  Ambulation/Gait             General Gait Details: deferred further gait training secondary to pain with hopping; she was able to demonstrate 1-2 hops on R LE with L PFRW during transfers  Stairs            Wheelchair Mobility    Modified Rankin (Stroke Patients Only)       Balance Overall balance assessment: Needs assistance Sitting-balance support: Feet supported Sitting balance-Leahy Scale: Fair     Standing balance support: Single extremity supported;Bilateral upper extremity supported Standing balance-Leahy Scale: Poor Standing balance comment: reliant on BUE support in standing while adhering to precautions                             Pertinent Vitals/Pain Pain Assessment: Faces Pain Score: 4  Faces Pain Scale: Hurts little more Pain Location: L ankle Pain Descriptors / Indicators: Sore Pain Intervention(s): Monitored during session;Repositioned    Home Living Family/patient expects to be discharged to:: Private residence Living Arrangements: Spouse/significant other Available Help at Discharge: Family;Available 24  hours/day Type of Home: House Home Access: Stairs to enter Entrance Stairs-Rails: Left Entrance Stairs-Number of Steps: 3 Home Layout: One level Home Equipment: Wheelchair - manual Additional Comments: pt's mother recently passed away, she will be able to get her wheelchair if needed    Prior Function Level of  Independence: Independent         Comments: pt is retired, enjoys going to [employer] in the morning and spending time with family and friends     Hand Dominance   Dominant Hand: Right    Extremity/Trunk Assessment   Upper Extremity Assessment Upper Extremity Assessment: Defer to OT evaluation LUE Deficits / Details: no reports of numbness/tingling in hand;able to wiggle fingers;no pain with elbow/shoulder ROM;limited assessment due to immobilization;dressing in place;sling  LUE: Unable to fully assess due to immobilization LUE Sensation: WNL LUE Coordination: decreased fine motor;decreased gross motor    Lower Extremity Assessment Lower Extremity Assessment: LLE deficits/detail LLE Deficits / Details: external fixator present and attached to foot and lower leg; pt able to flex/extend all toes; pt able to maintain NWB throughout independently and able to move LE against gravity with functional mobility LLE: Unable to fully assess due to immobilization    Cervical / Trunk Assessment Cervical / Trunk Assessment: Normal  Communication   Communication: No difficulties  Cognition Arousal/Alertness: Awake/alert Behavior During Therapy: WFL for tasks assessed/performed Overall Cognitive Status: Within Functional Limits for tasks assessed                                 General Comments: good adherence to WB precautions      General Comments General comments (skin integrity, edema, etc.): vss;Pt on RA upon arrival, SpO2 99% at rest    Exercises     Assessment/Plan    PT Assessment Patient needs continued PT services  PT Problem List Decreased strength;Decreased range of motion;Decreased activity tolerance;Decreased balance;Decreased mobility;Decreased coordination;Decreased knowledge of use of DME;Decreased safety awareness;Decreased knowledge of precautions;Pain       PT Treatment Interventions DME instruction;Gait training;Stair training;Functional  mobility training;Therapeutic exercise;Therapeutic activities;Neuromuscular re-education;Balance training;Patient/family education    PT Goals (Current goals can be found in the Care Plan section)  Acute Rehab PT Goals Patient Stated Goal: to get stronger before returning home PT Goal Formulation: With patient/family Time For Goal Achievement: 06/19/19 Potential to Achieve Goals: Good    Frequency Min 5X/week   Barriers to discharge        Co-evaluation               AM-PAC PT "6 Clicks" Mobility  Outcome Measure Help needed turning from your back to your side while in a flat bed without using bedrails?: None Help needed moving from lying on your back to sitting on the side of a flat bed without using bedrails?: A Little Help needed moving to and from a bed to a chair (including a wheelchair)?: A Little Help needed standing up from a chair using your arms (e.g., wheelchair or bedside chair)?: A Little Help needed to walk in hospital room?: A Little Help needed climbing 3-5 steps with a railing? : Total 6 Click Score: 17    End of Session Equipment Utilized During Treatment: Gait belt Activity Tolerance: Patient tolerated treatment well Patient left: in bed;with call bell/phone within reach;with family/visitor present Nurse Communication: Mobility status PT Visit Diagnosis: Other abnormalities of gait and mobility (R26.89);Pain Pain - Right/Left: Left Pain -  part of body: Arm;Leg    Time: 1130-1153 PT Time Calculation (min) (ACUTE ONLY): 23 min   Charges:   PT Evaluation $PT Eval Moderate Complexity: 1 Mod PT Treatments $Therapeutic Activity: 8-22 mins        Arletta Bale, DPT  Acute Rehabilitation Services Pager [phone] Office [phone]    Regina Goodman 06/05/2019, 12:58 PM

## 2019-06-05 NOTE — Consult Note (Signed)
Orthopaedic Trauma Service (OTS) Consult   Patient ID: Regina Goodman MRN: [id number] DOB/AGE: 1965-09-08 54 y.o.  Reason for Consult:Left pilon fracture Referring Physician: Dr. Ophelia Charter , MD w/ [facility]  HPI: Regina Goodman is an 54 y.o. female who is being seen in consultation at the request of Dr. Griffin Basil for evaluation of left pilon fracture.  The patient had fallen after getting tangled up in her dog's leash.  She sustained a left distal radius fracture and a left distal tibia fracture with intra-articular extension.  She was taken urgently for open reduction internal fixation of her left distal radius fracture along with external fixation of her left distal tibia fracture.  Due to the complexity of her injury to her left lower extremity Dr. Griffin Basil felt that it was outside the scope of practice and recommended treatment by an orthopedic traumatologist.  Patient was seen at bedside on 5 N. this morning.  She is comfortable without significant pain.  Denies any numbness or tingling.  Denies any injuries to her right lower extremity.  Husband is on the phone and I was able to talk to the both of them during evaluation.  She is a retired Radio producer.  She denies any heart history or diabetes.  She ambulates without assist device.  She denies any tobacco use.  She lives on a single-story home with her husband with a few steps to enter.  As noted above her pain is relatively well controlled.  She denies any significant problems unless she tries to move her leg.  History reviewed. No pertinent past medical history.  History reviewed. No pertinent surgical history.  Family History  Problem Relation Age of Onset  . Breast cancer Maternal Aunt   . Breast cancer Maternal Grandmother     Social History:  has no history on file for tobacco, alcohol, and drug.  Allergies:  Allergies  Allergen Reactions  . Penicillins Hives and Swelling    Medications:  No current facility-administered  medications on file prior to encounter.   Current Outpatient Medications on File Prior to Encounter  Medication Sig Dispense Refill  . sertraline (ZOLOFT) 100 MG tablet Take 100 mg by mouth daily.       ROS: Constitutional: No fever or chills Vision: No changes in vision ENT: No difficulty swallowing CV: No chest pain Pulm: No SOB or wheezing GI: No nausea or vomiting GU: No urgency or inability to hold urine Skin: No poor wound healing Neurologic: No numbness or tingling Psychiatric: No depression or anxiety Heme: No bruising Allergic: No reaction to medications or food   Exam: Blood pressure 101/60, pulse 67, temperature 98.1 F (36.7 C), temperature source Oral, resp. rate 16, height 5\' 6"  (1.676 m), weight 69.9 kg, last menstrual period 10/14/2013, SpO2 98 %. General: No acute distress Orientation: Awake alert and oriented x3 Mood and Affect: Cooperative and pleasant. Gait: Unable to assess due to her fracture Coordination and balance: Within normal limits  Left lower extremity: Exfix is in place pin sites are clean dry and intact.  The patient has moderate swelling but she does skin wrinkle about a planned anterior medial or anterior lateral incision.  She endorses sensation to the dorsum and plantar aspect of her foot.  She has notable tenderness.  No open wounds.  She is active dorsiflexion plantarflexion of her toes.  No obvious deformity or abrasions about her knee.  No pain with gentle logroll maneuver of her left hip.  No lymphadenopathy and reflexes are within normal  limits.  She has a warm well-perfused foot with brisk cap refill.  Right lower extremity: Skin without lesions. No tenderness to palpation. Full painless ROM, full strength in each muscle groups without evidence of instability.  Left upper extremity: Splint and sling is in place.  Compartment soft and compressible.  She has motor and sensory function intact.  She has brisk cap refill.  No pain about the  shoulder.   Medical Decision Making: Data: Imaging: X-rays and CT scan of the left distal tibia are reviewed which shows a spiral oblique distal tibia fracture with intra-articular extension with large posterior malleolus fracture that is a relatively anatomic.  There is a Weber B distal fibula fracture as well.  Ankle mortise is well reduced.  Some mild distraction at the tibiotalar joint.  Labs:  Results for orders placed or performed during the hospital encounter of 06/04/19 (from the past 24 hour(s))  CBC     Status: Abnormal   Collection Time: 06/04/19  1:44 PM  Result Value Ref Range   WBC 9.2 4.0 - 10.5 K/uL   RBC 3.96 3.87 - 5.11 MIL/uL   Hemoglobin 11.9 (L) 12.0 - 15.0 g/dL   HCT 47.0 96.2 - 83.6 %   MCV 92.9 80.0 - 100.0 fL   MCH 30.1 26.0 - 34.0 pg   MCHC 32.3 30.0 - 36.0 g/dL   RDW 62.9 47.6 - 54.6 %   Platelets 226 150 - 400 K/uL   nRBC 0.0 0.0 - 0.2 %  Basic metabolic panel     Status: Abnormal   Collection Time: 06/04/19  1:44 PM  Result Value Ref Range   Sodium 138 135 - 145 mmol/L   Potassium 3.8 3.5 - 5.1 mmol/L   Chloride 102 98 - 111 mmol/L   CO2 26 22 - 32 mmol/L   Glucose, Bld 150 (H) 70 - 99 mg/dL   BUN 17 6 - 20 mg/dL   Creatinine, Ser 5.03 0.44 - 1.00 mg/dL   Calcium 8.7 (L) 8.9 - 10.3 mg/dL   GFR calc non Af Amer >60 >60 mL/min   GFR calc Af Amer >60 >60 mL/min   Anion gap 10 5 - 15  Respiratory Panel by RT PCR (Flu A&B, Covid) - Nasopharyngeal Swab     Status: None   Collection Time: 06/04/19  2:48 PM   Specimen: Nasopharyngeal Swab  Result Value Ref Range   SARS Coronavirus 2 by RT PCR NEGATIVE NEGATIVE   Influenza A by PCR NEGATIVE NEGATIVE   Influenza B by PCR NEGATIVE NEGATIVE  Surgical pcr screen     Status: None   Collection Time: 06/04/19  5:09 PM   Specimen: Nasal Mucosa; Nasal Swab  Result Value Ref Range   MRSA, PCR NEGATIVE NEGATIVE   Staphylococcus aureus NEGATIVE NEGATIVE  CBC     Status: Abnormal   Collection Time: 06/05/19   6:18 AM  Result Value Ref Range   WBC 9.2 4.0 - 10.5 K/uL   RBC 3.52 (L) 3.87 - 5.11 MIL/uL   Hemoglobin 10.7 (L) 12.0 - 15.0 g/dL   HCT 54.6 (L) 56.8 - 12.7 %   MCV 91.5 80.0 - 100.0 fL   MCH 30.4 26.0 - 34.0 pg   MCHC 33.2 30.0 - 36.0 g/dL   RDW 51.7 00.1 - 74.9 %   Platelets 219 150 - 400 K/uL   nRBC 0.0 0.0 - 0.2 %  Basic metabolic panel     Status: Abnormal   Collection Time: 06/05/19  6:18 AM  Result Value Ref Range   Sodium 137 135 - 145 mmol/L   Potassium 3.7 3.5 - 5.1 mmol/L   Chloride 101 98 - 111 mmol/L   CO2 25 22 - 32 mmol/L   Glucose, Bld 135 (H) 70 - 99 mg/dL   BUN 12 6 - 20 mg/dL   Creatinine, Ser 0.85 0.44 - 1.00 mg/dL   Calcium 8.8 (L) 8.9 - 10.3 mg/dL   GFR calc non Af Amer >60 >60 mL/min   GFR calc Af Amer >60 >60 mL/min   Anion gap 11 5 - 15    Imaging or Labs ordered: None new ordered  Medical history and chart was reviewed and case discussed with medical provider.  Assessment/Plan: 53-year-old female status post fall with a left distal radius fracture status post open reduction internal fixation and a left distal tibia fracture with intra-articular extension status post external fixation.  Due to the unstable nature of her injury and intra-articular nature of her injury I recommend proceeding with open reduction internal fixation.  I feel that her swelling currently is appropriate to proceed with the surgery potentially tomorrow.  I will tentatively put her on the schedule with plans for n.p.o. after midnight with an evaluation first thing tomorrow morning to assess whether soft tissues could tolerate an incision.  I discussed risks and benefits with the patient and her husband over the phone.  Risks included but not limited to bleeding, infection, malunion, nonunion, hardware failure, hardware irritation, nerve and blood vessel injury, ankle stiffness, posttraumatic arthritis, DVT, even the possibility anesthetic complications.  The patient agreed to proceed  with surgery and consent will be obtained.  Nassir Neidert P. Ashdon Gillson, MD [facility] [phone] (office) [email or web address]  

## 2019-06-05 NOTE — Progress Notes (Signed)
   ORTHOPAEDIC PROGRESS NOTE  s/p Procedure(s): OPEN REDUCTION INTERNAL FIXATION (ORIF) WRIST FRACTURE EXTERNAL FIXATION LEG On 06/04/2019 by Dr. Griffin Basil  SUBJECTIVE: Patient is sitting up in hospital chair. She reports her pain has been well controlled. Husband is at bedside. She met with Dr. Doreatha Martin earlier this morning to discuss definitive treatment for her right lower extremity. Plan for OR tomorrow. No chest pain. No SOB. No nausea/vomiting. No other complaints.  OBJECTIVE: PE: Left upper extremity: Splint CDI. Skin intact though cannot assess fully beneath splint. Nontender to palpation proximally, with full and painless ROM throughout hand with DPC of 0. + Motor in  AIN, PIN, Ulnar distributions. Sensation intact in medial, radial, and ulnar distributions. Well perfused digits.   Left lower extremity: Ex-fix in place. Pin sites are CDI. Swelling about right ankle and foot. Somel skin wrinkling. She is able to wiggle her toes. She endorses distal sensation. Warm, well perfused foot.    Vitals:   06/05/19 0022 06/05/19 0342  BP: 110/61 119/62  Pulse: 74 76  Resp: 16 16  Temp: 98.4 F (36.9 C) 98.3 F (36.8 C)  SpO2: 97% 97%    ASSESSMENT: Regina Goodman is a 54 y.o. female doing well postoperatively. POD#1  PLAN: Weightbearing:  Left upper extremity: NWB left hand. WBAT L elbow Left lower extremity: NWB Insicional and dressing care: Left upper extremity: splint. Keep splint clean and dry. Left lower extremity: reinforce pin site dressings PRN Orthopedic device(s):  Left upper extremity: splint. Sling for comfort Left lower extremity: external fixator VTE prophylaxis: Lovenox 40 mg/day Pain control: PRN pain medications, preferring oral medications.  Follow - up plan:  Left upper extremity: approximately 1 week for incision check and xray.  Left lower extremity: will require definitive fixation at a later date. We will consult orthopedic traumatologist regarding this.  Dr. Doreatha Martin saw the patient this morning and will take over care for left lower extremity. Plan for ORIF tomorrow with Dr. Doreatha Martin if swelling is appropriate.   Contact information:  Dr. Ophelia Charter, [facility] PA-C    [place], [place] 06/05/2019

## 2019-06-06 ENCOUNTER — Inpatient Hospital Stay (HOSPITAL_COMMUNITY): Payer: BC Managed Care – PPO

## 2019-06-06 ENCOUNTER — Inpatient Hospital Stay (HOSPITAL_COMMUNITY): Payer: BC Managed Care – PPO | Admitting: Certified Registered Nurse Anesthetist

## 2019-06-06 ENCOUNTER — Encounter (HOSPITAL_COMMUNITY)
Admission: EM | Disposition: A | Discharge status: Home Health | Payer: Self-pay | Source: Home / Self Care | Attending: Student

## 2019-06-06 ENCOUNTER — Encounter (HOSPITAL_COMMUNITY): Payer: Self-pay | Admitting: Orthopaedic Surgery

## 2019-06-06 HISTORY — PX: OPEN REDUCTION INTERNAL FIXATION (ORIF) TIBIA/FIBULA FRACTURE: SHX5992

## 2019-06-06 LAB — BASIC METABOLIC PANEL
Anion gap: 9 (ref 5–15)
BUN: 8 mg/dL (ref 6–20)
CO2: 28 mmol/L (ref 22–32)
Calcium: 8.5 mg/dL — ABNORMAL LOW (ref 8.9–10.3)
Chloride: 104 mmol/L (ref 98–111)
Creatinine, Ser: 0.75 mg/dL (ref 0.44–1.00)
GFR calc Af Amer: >60 mL/min (ref >60)
GFR calc non Af Amer: >60 mL/min (ref >60)
Glucose, Bld: 99 mg/dL (ref 70–99)
Potassium: 3.8 mmol/L (ref 3.5–5.1)
Sodium: 141 mmol/L (ref 135–145)

## 2019-06-06 LAB — CBC
HCT: 31.3 % — ABNORMAL LOW (ref 36.0–46.0)
Hemoglobin: 10.1 g/dL — ABNORMAL LOW (ref 12.0–15.0)
MCH: 30 pg (ref 26.0–34.0)
MCHC: 32.3 g/dL (ref 30.0–36.0)
MCV: 92.9 fL (ref 80.0–100.0)
Platelets: 186 10*3/uL (ref 150–400)
RBC: 3.37 MIL/uL — ABNORMAL LOW (ref 3.87–5.11)
RDW: 13.3 % (ref 11.5–15.5)
WBC: 5.9 10*3/uL (ref 4.0–10.5)
nRBC: 0 % (ref 0.0–0.2)

## 2019-06-06 SURGERY — OPEN REDUCTION INTERNAL FIXATION (ORIF) TIBIA/FIBULA FRACTURE
Anesthesia: General | Laterality: Left

## 2019-06-06 MED ORDER — ROPIVACAINE HCL 5 MG/ML IJ SOLN
INTRAMUSCULAR | Status: DC | PRN
  Administered 2019-06-06: 30 mL via PERINEURAL

## 2019-06-06 MED ORDER — LACTATED RINGERS IV SOLN: INTRAVENOUS | Status: DC

## 2019-06-06 MED ORDER — CLINDAMYCIN PHOSPHATE 900 MG/50ML IV SOLN
INTRAVENOUS | Status: DC | PRN
  Administered 2019-06-06: 900 mg via INTRAVENOUS

## 2019-06-06 MED ORDER — MIDAZOLAM HCL 2 MG/2ML IJ SOLN
INTRAMUSCULAR | Status: AC
  Administered 2019-06-06: 1 mg via INTRAVENOUS
  Filled 2019-06-06: qty 2

## 2019-06-06 MED ORDER — LIDOCAINE HCL (CARDIAC) PF 100 MG/5ML IV SOSY
PREFILLED_SYRINGE | INTRAVENOUS | Status: DC | PRN
  Administered 2019-06-06: 40 mg via INTRAVENOUS

## 2019-06-06 MED ORDER — PROPOFOL 500 MG/50ML IV EMUL
INTRAVENOUS | Status: DC | PRN
  Administered 2019-06-06: 100 ug/kg/min via INTRAVENOUS
  Administered 2019-06-06: 50 ug/kg/min via INTRAVENOUS

## 2019-06-06 MED ORDER — HYDROMORPHONE HCL 1 MG/ML IJ SOLN: 0.2500 mg | INTRAMUSCULAR | Status: DC | PRN

## 2019-06-06 MED ORDER — ONDANSETRON HCL 4 MG/2ML IJ SOLN: 4.0000 mg | Freq: Once | INTRAMUSCULAR | Status: DC | PRN

## 2019-06-06 MED ORDER — FENTANYL CITRATE (PF) 100 MCG/2ML IJ SOLN: 100.0000 ug | Freq: Once | INTRAMUSCULAR | Status: AC

## 2019-06-06 MED ORDER — ONDANSETRON HCL 4 MG/2ML IJ SOLN
INTRAMUSCULAR | Status: DC | PRN
  Administered 2019-06-06: 4 mg via INTRAVENOUS

## 2019-06-06 MED ORDER — PHENYLEPHRINE HCL-NACL 10-0.9 MG/250ML-% IV SOLN
INTRAVENOUS | Status: DC | PRN
  Administered 2019-06-06: 25 ug/min via INTRAVENOUS

## 2019-06-06 MED ORDER — FENTANYL CITRATE (PF) 100 MCG/2ML IJ SOLN
INTRAMUSCULAR | Status: AC
  Administered 2019-06-06: 100 ug via INTRAVENOUS
  Filled 2019-06-06: qty 2

## 2019-06-06 MED ORDER — CLINDAMYCIN PHOSPHATE 900 MG/50ML IV SOLN
900.0000 mg | Freq: Three times a day (TID) | INTRAVENOUS | Status: AC
  Administered 2019-06-06 – 2019-06-07 (×3): 900 mg via INTRAVENOUS
  Filled 2019-06-06 (×3): qty 50

## 2019-06-06 MED ORDER — CLINDAMYCIN PHOSPHATE 900 MG/50ML IV SOLN
INTRAVENOUS | Status: AC
  Filled 2019-06-06: qty 50

## 2019-06-06 MED ORDER — VANCOMYCIN HCL 1000 MG IV SOLR
INTRAVENOUS | Status: AC
  Filled 2019-06-06: qty 1000

## 2019-06-06 MED ORDER — ENOXAPARIN SODIUM 40 MG/0.4ML ~~LOC~~ SOLN
40.0000 mg | SUBCUTANEOUS | Status: DC
  Administered 2019-06-07 – 2019-06-11 (×5): 40 mg via SUBCUTANEOUS
  Filled 2019-06-06 (×5): qty 0.4

## 2019-06-06 MED ORDER — ROPIVACAINE HCL 7.5 MG/ML IJ SOLN
INTRAMUSCULAR | Status: DC | PRN
  Administered 2019-06-06: 20 mL via PERINEURAL

## 2019-06-06 MED ORDER — VANCOMYCIN HCL 1000 MG IV SOLR
INTRAVENOUS | Status: DC | PRN
  Administered 2019-06-06: 1000 mg via TOPICAL

## 2019-06-06 MED ORDER — PROPOFOL 10 MG/ML IV BOLUS
INTRAVENOUS | Status: DC | PRN
  Administered 2019-06-06: 20 mg via INTRAVENOUS
  Administered 2019-06-06: 40 mg via INTRAVENOUS
  Administered 2019-06-06: 20 mg via INTRAVENOUS

## 2019-06-06 MED ORDER — MIDAZOLAM HCL 2 MG/2ML IJ SOLN: 1.0000 mg | Freq: Once | INTRAMUSCULAR | Status: AC

## 2019-06-06 MED ORDER — 0.9 % SODIUM CHLORIDE (POUR BTL) OPTIME
TOPICAL | Status: DC | PRN
  Administered 2019-06-06: 1000 mL

## 2019-06-06 SURGICAL SUPPLY — 60 items
APL PRP STRL LF DISP 70% ISPRP (MISCELLANEOUS) ×1
BIT DRILL 2.5X2.75 QC CALB (BIT) ×2 IMPLANT
BIT DRILL CALIBRATED 2.7 (BIT) ×1 IMPLANT
BIT DRILL CALIBRATED 2.7MM (BIT) ×1
BNDG COHESIVE 4X5 TAN STRL (GAUZE/BANDAGES/DRESSINGS) ×3 IMPLANT
BRUSH SCRUB EZ PLAIN DRY (MISCELLANEOUS) ×6 IMPLANT
CHLORAPREP W/TINT 26 (MISCELLANEOUS) ×3 IMPLANT
COVER SURGICAL LIGHT HANDLE (MISCELLANEOUS) ×3 IMPLANT
DRAPE C-ARM 42X72 X-RAY (DRAPES) ×3 IMPLANT
DRAPE C-ARMOR (DRAPES) ×3 IMPLANT
DRAPE ORTHO SPLIT 77X108 STRL (DRAPES) ×6
DRAPE SURG ORHT 6 SPLT 77X108 (DRAPES) ×2 IMPLANT
DRAPE U-SHAPE 47X51 STRL (DRAPES) ×3 IMPLANT
ELECT REM PT RETURN 9FT ADLT (ELECTROSURGICAL) ×3
ELECTRODE REM PT RTRN 9FT ADLT (ELECTROSURGICAL) ×1 IMPLANT
GAUZE SPONGE 4X4 12PLY STRL (GAUZE/BANDAGES/DRESSINGS) ×2 IMPLANT
GLOVE BIO SURGEON STRL SZ 6.5 (GLOVE) ×6 IMPLANT
GLOVE BIO SURGEON STRL SZ7.5 (GLOVE) ×9 IMPLANT
GLOVE BIO SURGEONS STRL SZ 6.5 (GLOVE) ×3
GLOVE BIOGEL PI IND STRL 6.5 (GLOVE) ×1 IMPLANT
GLOVE BIOGEL PI IND STRL 7.5 (GLOVE) ×1 IMPLANT
GLOVE BIOGEL PI INDICATOR 6.5 (GLOVE) ×2
GLOVE BIOGEL PI INDICATOR 7.5 (GLOVE) ×2
GOWN STRL REUS W/ TWL LRG LVL3 (GOWN DISPOSABLE) ×2 IMPLANT
GOWN STRL REUS W/TWL LRG LVL3 (GOWN DISPOSABLE) ×6
K-WIRE ACE 1.6X6 (WIRE) ×6
KIT TURNOVER KIT B (KITS) ×3 IMPLANT
KWIRE ACE 1.6X6 (WIRE) IMPLANT
MANIFOLD NEPTUNE II (INSTRUMENTS) ×3 IMPLANT
NDL HYPO 21X1.5 SAFETY (NEEDLE) IMPLANT
NEEDLE HYPO 21X1.5 SAFETY (NEEDLE) IMPLANT
NS IRRIG 1000ML POUR BTL (IV SOLUTION) ×3 IMPLANT
PACK TOTAL JOINT (CUSTOM PROCEDURE TRAY) ×3 IMPLANT
PAD ARMBOARD 7.5X6 YLW CONV (MISCELLANEOUS) ×6 IMPLANT
PAD CAST 4YDX4 CTTN HI CHSV (CAST SUPPLIES) IMPLANT
PADDING CAST COTTON 4X4 STRL (CAST SUPPLIES) ×3
PADDING CAST COTTON 6X4 STRL (CAST SUPPLIES) ×2 IMPLANT
PLATE 9H 184 LT MED DIST TIB (Plate) ×2 IMPLANT
SCREW CORTICAL 3.5MM 24MM (Screw) ×2 IMPLANT
SCREW CORTICAL 3.5MM 26MM (Screw) ×4 IMPLANT
SCREW CORTICAL 3.5MM 40MM (Screw) ×4 IMPLANT
SCREW LOCK CORT STAR 3.5X32 (Screw) ×4 IMPLANT
SCREW LOCK CORT STAR 3.5X34 (Screw) ×4 IMPLANT
SCREW LOCK CORT STAR 3.5X38 (Screw) ×2 IMPLANT
SCREW T15 LP CORT 3.5X48MM NS (Screw) ×2 IMPLANT
SCREW T15 MD 3.5X32MM NS (Screw) ×2 IMPLANT
SCREW T15 MD 3.5X38MM NS (Screw) ×2 IMPLANT
SUCTION FRAZIER HANDLE 10FR (MISCELLANEOUS) ×3
SUCTION TUBE FRAZIER 10FR DISP (MISCELLANEOUS) ×1 IMPLANT
SUT ETHILON 3 0 PS 1 (SUTURE) ×6 IMPLANT
SUT MNCRL AB 3-0 PS2 18 (SUTURE) ×4 IMPLANT
SUT VIC AB 0 CT1 27 (SUTURE) ×3
SUT VIC AB 0 CT1 27XBRD ANBCTR (SUTURE) ×1 IMPLANT
SUT VIC AB 2-0 CT1 27 (SUTURE) ×6
SUT VIC AB 2-0 CT1 TAPERPNT 27 (SUTURE) ×2 IMPLANT
TAPE STRIPS DRAPE STRL (GAUZE/BANDAGES/DRESSINGS) ×2 IMPLANT
TOWEL GREEN STERILE (TOWEL DISPOSABLE) ×6 IMPLANT
TOWEL GREEN STERILE FF (TOWEL DISPOSABLE) ×3 IMPLANT
UNDERPAD 30X30 (UNDERPADS AND DIAPERS) ×3 IMPLANT
WATER STERILE IRR 1000ML POUR (IV SOLUTION) ×3 IMPLANT

## 2019-06-06 NOTE — Anesthesia Procedure Notes (Signed)
Anesthesia Regional Block: Popliteal block   Pre-Anesthetic Checklist: ,, timeout performed, Correct Patient, Correct Site, Correct Laterality, Correct Procedure, Correct Position, site marked, Risks and benefits discussed,  Surgical consent,  Pre-op evaluation,  At surgeon's request and post-op pain management  Laterality: Left  Prep: chloraprep       Needles:  Injection technique: Single-shot  Needle Type: Echogenic Stimulator Needle     Needle Length: 9cm  Needle Gauge: 21   Needle insertion depth: 6 cm   Additional Needles:   Procedures:,,,, ultrasound used (permanent image in chart),,,,  Narrative:  Start time: 06/06/2019 11:20 AM End time: 06/06/2019 11:25 AM Injection made incrementally with aspirations every 5 mL.  Performed by: Personally  Anesthesiologist: Mal Amabile, MD  Additional Notes: Timeout performed. Patient sedated. Relevant anatomy ID'd using [place]. Incremental 2-109ml injection of LA with frequent aspiration. Patient tolerated procedure well.        Left Popliteal Block

## 2019-06-06 NOTE — Anesthesia Postprocedure Evaluation (Signed)
Anesthesia Post Note  Patient: Regina Goodman  Procedure(s) Performed: OPEN REDUCTION INTERNAL FIXATION (ORIF) TIBIA/FIBULA FRACTURE (Left )     Patient location during evaluation: PACU Anesthesia Type: General and Regional Level of consciousness: awake and alert Pain management: pain level controlled Vital Signs Assessment: post-procedure vital signs reviewed and stable Respiratory status: spontaneous breathing, nonlabored ventilation, respiratory function stable and patient connected to nasal cannula oxygen Cardiovascular status: stable and blood pressure returned to baseline Postop Assessment: no apparent nausea or vomiting Anesthetic complications: no    Last Vitals:  Vitals:   06/06/19 1500 06/06/19 1515  BP: 113/68 121/62  Pulse: 60 61  Resp: 11 14  Temp:  36.5 C  SpO2: 100% 99%    Last Pain:  Vitals:   06/06/19 1500  TempSrc:   PainSc: 0-No pain                 Teva Bronkema COKER

## 2019-06-06 NOTE — Plan of Care (Signed)
  Problem: Education: Goal: Knowledge of General Education information will improve Description: Including pain rating scale, medication(s)/side effects and non-pharmacologic comfort measures Outcome: Progressing   Problem: Health Behavior/Discharge Planning: Goal: Ability to manage health-related needs will improve Outcome: Progressing   Problem: Activity: Goal: Risk for activity intolerance will decrease Outcome: Progressing   Problem: Nutrition: Goal: Adequate nutrition will be maintained Outcome: Progressing   Problem: Coping: Goal: Level of anxiety will decrease Outcome: Progressing   Problem: Elimination: Goal: Will not experience complications related to bowel motility Outcome: Progressing   Problem: Pain Managment: Goal: General experience of comfort will improve Outcome: Progressing   Problem: Safety: Goal: Ability to remain free from injury will improve Outcome: Progressing   

## 2019-06-06 NOTE — Interval H&P Note (Signed)
History and Physical Interval Note:  06/06/2019 12:03 PM  Regina Goodman  has presented today for surgery, with the diagnosis of Right pilon fracture.  The various methods of treatment have been discussed with the patient and family. After consideration of risks, benefits and other options for treatment, the patient has consented to  Procedure(s): OPEN REDUCTION INTERNAL FIXATION (ORIF) TIBIA/FIBULA FRACTURE (Left) as a surgical intervention.  The patient's history has been reviewed, patient examined, no change in status, stable for surgery.  I have reviewed the patient's chart and labs.  Questions were answered to the patient's satisfaction.     Caryn Bee P Cliff Damiani

## 2019-06-06 NOTE — Transfer of Care (Signed)
Immediate Anesthesia Transfer of Care Note  Patient: Regina Goodman  Procedure(s) Performed: OPEN REDUCTION INTERNAL FIXATION (ORIF) TIBIA/FIBULA FRACTURE (Left )  Patient Location: PACU  Anesthesia Type:MAC combined with regional for post-op pain  Level of Consciousness: drowsy and patient cooperative  Airway & Oxygen Therapy: Patient Spontanous Breathing and Patient connected to face mask oxygen  Post-op Assessment: Report given to RN and Post -op Vital signs reviewed and stable  Post vital signs: Reviewed and stable  Last Vitals:  Vitals Value Taken Time  BP 106/59 06/06/19 1431  Temp    Pulse 77 06/06/19 1432  Resp 17 06/06/19 1432  SpO2 100 % 06/06/19 1432  Vitals shown include unvalidated device data.  Last Pain:  Vitals:   06/06/19 0904  TempSrc: Oral  PainSc:          Complications: No apparent anesthesia complications

## 2019-06-06 NOTE — Anesthesia Procedure Notes (Signed)
Anesthesia Regional Block: Adductor canal block   Pre-Anesthetic Checklist: ,, timeout performed, Correct Patient, Correct Site, Correct Laterality, Correct Procedure, Correct Position, site marked, Risks and benefits discussed,  Surgical consent,  Pre-op evaluation,  At surgeon's request and post-op pain management  Laterality: Left  Prep: chloraprep       Needles:  Injection technique: Single-shot  Needle Type: Echogenic Stimulator Needle     Needle Length: 9cm  Needle Gauge: 21   Needle insertion depth: 7 cm   Additional Needles:   Procedures:,,,, ultrasound used (permanent image in chart),,,,  Narrative:  Start time: 06/06/2019 11:25 AM End time: 06/06/2019 11:30 AM Injection made incrementally with aspirations every 5 mL.  Performed by: Personally  Anesthesiologist: Mal Amabile, MD  Additional Notes: Timeout performed. Patient sedated. Relevant anatomy ID'd using [place]. Incremental 2-12ml injection of LA with frequent aspiration. Patient tolerated procedure well.        Left Adductor Canal Block

## 2019-06-06 NOTE — Plan of Care (Signed)
  Problem: Education: Goal: Knowledge of General Education information will improve Description: Including pain rating scale, medication(s)/side effects and non-pharmacologic comfort measures 06/06/2019 1634 by Loma Sousa, RN Outcome: Progressing 06/06/2019 0844 by Loma Sousa, RN Outcome: Progressing   Problem: Health Behavior/Discharge Planning: Goal: Ability to manage health-related needs will improve Outcome: Progressing   Problem: Clinical Measurements: Goal: Ability to maintain clinical measurements within normal limits will improve 06/06/2019 1634 by Loma Sousa, RN Outcome: Progressing 06/06/2019 0844 by Loma Sousa, RN Outcome: Progressing Goal: Will remain free from infection Outcome: Progressing Goal: Cardiovascular complication will be avoided Outcome: Progressing   Problem: Nutrition: Goal: Adequate nutrition will be maintained 06/06/2019 1634 by Loma Sousa, RN Outcome: Progressing 06/06/2019 0844 by Loma Sousa, RN Outcome: Progressing

## 2019-06-06 NOTE — Op Note (Signed)
Orthopaedic Surgery Operative Note (CSN: [id number] ) Date of Surgery: 06/06/2019  Admit Date: 06/04/2019   Diagnoses: Pre-Op Diagnoses: Left pilon fracture with tibial shaft extension   Post-Op Diagnosis: Same  Procedures: 1. CPT 27827-Open reduction internal fixation of left pilon fracture 2. CPT 27758-Open reduction internal fixation of left tibial shaft fracture 3. CPT 20694-Removal of external fixator left ankle  Surgeons : Primary: Jamileth Putzier, Thomasene Lot, MD  Assistant: Patrecia Pace, PA-C  Location: OR 3   Anesthesia:Regional with MAC  Antibiotics: Clindamycin 900 mg w/ 1 gm vancomycin powder topically   Tourniquet time:None    Estimated Blood SEGB:15 mL  Complications:None  Specimens:None   Implants: Implant Name Type Inv. Item Serial No. Manufacturer Lot No. LRB No. Used Action  PLATE 9H 176 LT MED DIST TIB - [id number] Plate PLATE 9H 269 LT MED DIST TIB  [employer](ORTH,TRAU,BIO,SG)  Left 1 Implanted  SCREW CORTICAL 3.5MM 24MM - [id number] Screw SCREW CORTICAL 3.5MM 24MM  [employer](ORTH,TRAU,BIO,SG)  Left 1 Implanted  SCREW CORTICAL 3.5MM 26MM - [id number] Screw SCREW CORTICAL 3.5MM 26MM  [employer](ORTH,TRAU,BIO,SG)  Left 2 Implanted  SCREW CORTICAL 3.5MM 40MM - [id number] Screw SCREW CORTICAL 3.5MM 40MM  [employer](ORTH,TRAU,BIO,SG)  Left 2 Implanted  SCREW T15 MD 3.5X38MM NS - [id number] Screw SCREW T15 MD 3.5X38MM NS  [employer](ORTH,TRAU,BIO,SG)  Left 1 Implanted  SCREW T15 MD 3.5X32MM NS - [id number] Screw SCREW T15 MD 3.5X32MM NS  [employer](ORTH,TRAU,BIO,SG)  Left 1 Implanted  SCREW LOCK CORT STAR 3.5X32 - [id number] Screw SCREW LOCK CORT STAR 3.5X32  [employer](ORTH,TRAU,BIO,SG)  Left 2 Implanted  SCREW LOCK CORT STAR 3.5X34 - [id number] Screw SCREW LOCK CORT STAR 3.5X34  [employer](ORTH,TRAU,BIO,SG)  Left 2 Implanted  SCREW LOCK CORT STAR 3.5X38 - [id number] Screw SCREW LOCK CORT STAR 3.5X38  [employer](ORTH,TRAU,BIO,SG)  Left 1 Implanted      Indications for Surgery: 54 year old female who sustained a fall and had a left distal radius fracture along with a left the tibial shaft fracture with intra-articular extension. She underwent urgent open reduction internal fixation of her left distal radius fracture along with external fixation of her left ankle and distal tibia with Dr. Griffin Basil. Due to the complexity of her injury he felt that this was outside the scope of practice and recommended treatment by an orthopedic traumatologist. I recommend proceeding with open reduction internal fixation of the left distal tibia. Risks and benefits were discussed with the patient. Risks included but not limited to bleeding, infection, malunion, nonunion, hardware failure, hardware irritation, nerve or blood vessel injury, ankle stiffness, ankle arthritis, DVT, even the possibility anesthetic complications. The patient agreed to proceed with surgery and consent was obtained.  Operative Findings: 1. Fixation of left intra-articular pilon fracture using anterior posterior 3.5 mm [employer]immer [employer] cortical screws through anterior lateral percutaneous incision 2. Open reduction internal fixation of left distal tibial shaft fracture using [employer] ALPS medial distal tibial locking plate. 3. Removal of external fixation from left ankle.  Procedure: The patient was identified in the preoperative holding area. Consent was confirmed with the patient and their family and all questions were answered. The operative extremity was marked after confirmation with the patient. she was then brought back to the operating room by our anesthesia colleagues. She was carefully transferred over to a radiolucent flat top table. She was placed under sedation and her left lower extremity was prepped and draped in usual sterile fashion. A timeout was performed to verify the patient, the procedure, and the  extremity. Preoperative antibiotics were dosed.  Fluoroscopic imaging was  obtained to show the unstable nature of her injury. Her talus was subluxating with the posterior malleolus fragment and the intra-articular split that was coronally based. I first started by placing a posterior lateral and anterior lateral percutaneous incision. I used a reduction tenaculum to clamp the intra-articular split from anterior to posterior and compress this and made an anatomic and confirmed this with fluoroscopy. I then a used 2.5 mm drill bit in an anterior to posterior direction through the anterior lateral incision and placed nonlocking screws to hold this fragment in position while the plate was placed. A total of two screws were placed and a fluoroscopic imaging confirmed positioning and anatomic reduction of the joint.  I then made a small anterior medial incision carefully dissected down to the bone. I kept the periosteum intact. I carefully dissected out the neurovascular bundle. I kept this anterior to the plate. I then used a Cobb elevator to clear a subcutaneous path for the plate along the medial cortex of the tibia. I chose a nine hole [employer] ALPS medial distal tibial locking plate and slid this subcutaneously along the cortex of the tibia. I made sure that it was aligned appropriately on AP and lateral fluoroscopic imaging. I then proceeded to place a nonlocking screw in the distal portion of the fracture to bring the distal portion of the plate flush to bone.  I then made percutaneous incisions to place 3.5 mm nonlocking screws into the tibial shaft proximal to the fracture. I was able to maintain appropriate alignment on both AP and lateral imaging. Once I had the proximal portion of the plate fixed I returned distally and proceeded to place locking screws into the distal segment. The initial nonlocking screw I removed and placed a locking screw in its position.  Final fluoroscopic imaging was obtained. The incision was copiously irrigated. 1 g of vancomycin powder was  placed into the anterior medial incision. A layer closure of 2-0 Vicryl and 3-0 Monocryl with Steri-Strips were used to close the skin. Sterile dressing was placed. A well-padded short leg splint was applied to her left ankle. She was then awoken from anesthesia and taken to the PACU in stable condition.  Post Op Plan/Instructions: Patient will be nonweightbearing to the left lower extremity. She will receive postoperative Ancef. She will continue on Lovenox for DVT prophylaxis. We will have her mobilize with physical and Occupational Therapy. She will return to see me in 2 weeks for x-rays and a wound check.  I was present and performed the entire surgery.  Patrecia Pace, PA-C did assist me throughout the case. An assistant was necessary given the difficulty in approach, maintenance of reduction and ability to instrument the fracture.   Katha Hamming, MD [facility]

## 2019-06-06 NOTE — Progress Notes (Signed)
PT Cancellation Note  Patient Details Name: Regina Goodman MRN: [id number] DOB: September 07, 1965   Cancelled Treatment:    Reason Eval/Treat Not Completed: Patient at procedure or test/unavailable. Pt to OR for ORIF L LE. PT will continue to f/u with pt acutely as available and appropriate.    [name] 06/06/2019, 12:35 PM

## 2019-06-06 NOTE — TOC Initial Note (Signed)
Transition of Care [facility]) - Initial/Assessment Note    Patient Details  Name: Regina Goodman MRN: [id number] Date of Birth: 06-07-1965  Transition of Care [facility]) CM/SW Contact:    Janae Bridgeman, RN Phone Number: 06/06/2019, 9:43 AM  Clinical Narrative:                  Pt is a 54 y.o. female presenting to the ED after sustaining a fall, resulting in left wrist and ankle fxs. Pt is now s/p left wrist ORIF and left LE external fixation.  Patient planned for surgery today on left lower leg.  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  Patient's PCP is Energy manager with [facility].  [facility] has medications [facility] at [facility] [address]    The patient currently has an old family wheelchair that will not be appropriate for non-weight-bearing mobility.  Talked with PT this morning and will wait to see what patient needs after surgery.  Patient is pending transition to home plan for CIR versus home with health health.  Patient is awaiting surgeru.  Expected Discharge Plan: Home/Self Care Barriers to Discharge: No Barriers Identified, Continued Medical Work up   Patient Goals and CMS Choice Patient states their goals for this hospitalization and ongoing recovery are:: I'm ready to have my surgery this afternoon to have my ankle fixed.      Expected Discharge Plan and Services Expected Discharge Plan: Home/Self Care       Living arrangements for the past 2 months: Single Family Home                                      Prior Living Arrangements/Services Living arrangements for the past 2 months: Single Family Home Lives with:: Adult Children, Spouse Patient language and need for interpreter reviewed:: Yes Do you feel safe going back to the place where you live?: Yes      Need for Family Participation in Patient Care: Yes (Comment) Care giver support system in place?: Yes  (comment) Current home services: (Patient has an old family wheelchair without leg lifts.  Patient will need appropriate mobility - probably non-weightbearing for 6 weeks per patient) Criminal Activity/Legal Involvement Pertinent to Current Situation/Hospitalization: No - Comment as needed  Activities of Daily Living Home Assistive Devices/Equipment: None ADL Screening (condition at time of admission) Patient's cognitive ability adequate to safely complete daily activities?: Yes Is the patient deaf or have difficulty hearing?: No Does the patient have difficulty seeing, even when wearing glasses/contacts?: No Does the patient have difficulty concentrating, remembering, or making decisions?: No Patient able to express need for assistance with ADLs?: Yes Does the patient have difficulty dressing or bathing?: No Independently performs ADLs?: Yes (appropriate for developmental age) Does the patient have difficulty walking or climbing stairs?: Yes Weakness of Legs: Left Weakness of Arms/Hands: Left  Permission Sought/Granted Permission sought to share information with : Case Manager Permission granted to share information with : Yes, Verbal Permission Granted     Permission granted to share info w AGENCY: Will call [employer] once patient out of surgery and pending mobility device recommendation by PT.        Emotional Assessment Appearance:: Appears stated age Attitude/Demeanor/Rapport: Engaged Affect (typically observed): Accepting Orientation: : Oriented to Self, Oriented to Place, Oriented to  Time, Oriented to  Situation Alcohol / Substance Use: Not Applicable Psych Involvement: No (comment)  Admission diagnosis:  Wrist fracture [S62.109A] Left wrist fracture [S62.102A] Fall from slip, trip, or stumble, initial encounter [W01.0XXA] Closed intra-articular fracture of distal end of left tibia, initial encounter [S82.392A] Closed fracture of distal end of left radius, unspecified  fracture morphology, initial encounter [S52.502A] Patient Active Problem List   Diagnosis Date Noted  . Left wrist fracture 06/04/2019   PCP:  [place], [place] @ [place]:   [id number] [place] [place] [address] [place] [address] Phone: [phone] Fax: [phone]     Social Determinants of Health (SDOH) Interventions    Readmission Risk Interventions No flowsheet data found.

## 2019-06-06 NOTE — Plan of Care (Signed)
  Problem: Pain Managment: Goal: General experience of comfort will improve Outcome: Progressing   Problem: Safety: Goal: Ability to remain free from injury will improve Outcome: Progressing   Problem: Skin Integrity: Goal: Risk for impaired skin integrity will decrease Outcome: Progressing   

## 2019-06-06 NOTE — Plan of Care (Signed)

## 2019-06-06 NOTE — Progress Notes (Signed)
Report given to short stay . Patient down via bed for ORIF.

## 2019-06-06 NOTE — Anesthesia Preprocedure Evaluation (Addendum)
Anesthesia Evaluation  Patient identified by MRN, date of birth, ID band Patient awake    Reviewed: Allergy & Precautions, H&P , NPO status , Patient's Chart, lab work & pertinent test results  History of Anesthesia Complications Negative for: history of anesthetic complications  Airway Mallampati: III  TM Distance: >3 FB Neck ROM: Full  Mouth opening: Limited Mouth Opening  Dental no notable dental hx. (+) Dental Advisory Given, Teeth Intact   Pulmonary neg pulmonary ROS,  06/04/2019 SARS coronavirus NEG   Pulmonary exam normal breath sounds clear to auscultation       Cardiovascular (-) anginanegative cardio ROS   Rhythm:Regular Rate:Normal     Neuro/Psych negative neurological ROS  negative psych ROS   GI/Hepatic negative GI ROS, Neg liver ROS,   Endo/Other  negative endocrine ROS  Renal/GU negative Renal ROS  negative genitourinary   Musculoskeletal Left Wrist Fx Left Pilon Fx - distal Tibia/Fibula Fx   Abdominal   Peds  Hematology  (+) anemia ,   Anesthesia Other Findings   Reproductive/Obstetrics negative OB ROS                            Anesthesia Physical  Anesthesia Plan  ASA: I  Anesthesia Plan: General   Post-op Pain Management:  Regional for Post-op pain   Induction: Intravenous  PONV Risk Score and Plan: 4 or greater and Ondansetron, Dexamethasone, Midazolam and Scopolamine patch - Pre-op  Airway Management Planned: Oral ETT and Video Laryngoscope Planned  Additional Equipment:   Intra-op Plan:   Post-operative Plan: Extubation in OR  Informed Consent: I have reviewed the patients History and Physical, chart, labs and discussed the procedure including the risks, benefits and alternatives for the proposed anesthesia with the patient or authorized representative who has indicated his/her understanding and acceptance.     Dental advisory given  Plan  Discussed with: CRNA and Surgeon  Anesthesia Plan Comments:         Anesthesia Quick Evaluation

## 2019-06-07 ENCOUNTER — Encounter: Payer: Self-pay | Admitting: *Deleted

## 2019-06-07 ENCOUNTER — Inpatient Hospital Stay (HOSPITAL_COMMUNITY): Payer: BC Managed Care – PPO

## 2019-06-07 DIAGNOSIS — S82872A Displaced pilon fracture of left tibia, initial encounter for closed fracture: Secondary | ICD-10-CM

## 2019-06-07 DIAGNOSIS — W19XXXA Unspecified fall, initial encounter: Secondary | ICD-10-CM

## 2019-06-07 DIAGNOSIS — S82209A Unspecified fracture of shaft of unspecified tibia, initial encounter for closed fracture: Secondary | ICD-10-CM | POA: Insufficient documentation

## 2019-06-07 LAB — CBC
HCT: 29.8 % — ABNORMAL LOW (ref 36.0–46.0)
Hemoglobin: 9.7 g/dL — ABNORMAL LOW (ref 12.0–15.0)
MCH: 30 pg (ref 26.0–34.0)
MCHC: 32.6 g/dL (ref 30.0–36.0)
MCV: 92.3 fL (ref 80.0–100.0)
Platelets: 187 10*3/uL (ref 150–400)
RBC: 3.23 MIL/uL — ABNORMAL LOW (ref 3.87–5.11)
RDW: 13.1 % (ref 11.5–15.5)
WBC: 6.4 10*3/uL (ref 4.0–10.5)
nRBC: 0 % (ref 0.0–0.2)

## 2019-06-07 LAB — BASIC METABOLIC PANEL
Anion gap: 11 (ref 5–15)
BUN: 8 mg/dL (ref 6–20)
CO2: 27 mmol/L (ref 22–32)
Calcium: 8.6 mg/dL — ABNORMAL LOW (ref 8.9–10.3)
Chloride: 102 mmol/L (ref 98–111)
Creatinine, Ser: 0.72 mg/dL (ref 0.44–1.00)
GFR calc Af Amer: >60 mL/min (ref >60)
GFR calc non Af Amer: >60 mL/min (ref >60)
Glucose, Bld: 105 mg/dL — ABNORMAL HIGH (ref 70–99)
Potassium: 3.5 mmol/L (ref 3.5–5.1)
Sodium: 140 mmol/L (ref 135–145)

## 2019-06-07 LAB — VITAMIN D 25 HYDROXY (VIT D DEFICIENCY, FRACTURES): Vit D, 25-Hydroxy: 20.25 ng/mL — ABNORMAL LOW (ref 30–100)

## 2019-06-07 NOTE — Progress Notes (Addendum)
Orthopaedic Trauma Progress Note  S: Doing fairly well today, nerve block started wearing off around 2AM and had increase in pain then. Pain better controlled now. No issues with wrist. Hasn't had a BM since Monday/Tuesday, denies abdominal pain. No other issues of note currently  O:  Vitals:   06/07/19 0601 06/07/19 0833  BP: (!) 117/59 (!) 103/53  Pulse: 66 74  Resp: 18 18  Temp: 98.4 F (36.9 C) 99.6 F (37.6 C)  SpO2: 98% 94%    General - Sitting up in bed, no acute distress Respiratory -  No increased work of breathing.  Left lower extremity - Splint in place, is clean, dry, intact.  Nerve block not fully worn off, diminished sensation with light touch of the toes as well as above splint.  Tolerates passive knee motion.  Not able to fully wiggle toes yet.  Toes are warm and well perfused.   Left upper extremity - Splint clean, dry, intact.  Sensation and motor function intact to the radial, median, ulnar nerve distributions.  Full elbow motion.  Hand is warm and well-perfused.  Imaging: Postop imaging of left ankle pending.  Labs:  Results for orders placed or performed during the hospital encounter of 06/04/19 (from the past 24 hour(s))  CBC     Status: Abnormal   Collection Time: 06/07/19  5:39 AM  Result Value Ref Range   WBC 6.4 4.0 - 10.5 K/uL   RBC 3.23 (L) 3.87 - 5.11 MIL/uL   Hemoglobin 9.7 (L) 12.0 - 15.0 g/dL   HCT 12.4 (L) 58.0 - 99.8 %   MCV 92.3 80.0 - 100.0 fL   MCH 30.0 26.0 - 34.0 pg   MCHC 32.6 30.0 - 36.0 g/dL   RDW 33.8 25.0 - 53.9 %   Platelets 187 150 - 400 K/uL   nRBC 0.0 0.0 - 0.2 %  Basic metabolic panel     Status: Abnormal   Collection Time: 06/07/19  5:39 AM  Result Value Ref Range   Sodium 140 135 - 145 mmol/L   Potassium 3.5 3.5 - 5.1 mmol/L   Chloride 102 98 - 111 mmol/L   CO2 27 22 - 32 mmol/L   Glucose, Bld 105 (H) 70 - 99 mg/dL   BUN 8 6 - 20 mg/dL   Creatinine, Ser 7.67 0.44 - 1.00 mg/dL   Calcium 8.6 (L) 8.9 - 10.3 mg/dL   GFR  calc non Af Amer >60 >60 mL/min   GFR calc Af Amer >60 >60 mL/min   Anion gap 11 5 - 15    Assessment: 54 year old female status post fall, 1 Day Post-Op   Injuries: 1. Left distal tibia fracture s/p removal of exfix and ORIF 2.  Left wrist fracture s/p ORIF by Dr. Everardo Pacific on 06/04/2019  Weightbearing: NWB LLE.  NWB left hand. WBAT L elbow  Insicional and dressing care: Leave splint on left upper and lower extremity until follow-up.  Keep splint clean and dry  Orthopedic device(s): Splint LUE, LLE  CV/Blood loss: Acute blood loss anemia, Hgb 9.2 this morning. Hemodynamically stable  Pain management:  1. Tylenol 1000 mg q 8 hours scheduled 2. Robaxin 500 mg q 6 hours PRN 3. Oxycodone 5-15 mg q 4 hours PRN 4. Neurontin 100 mg TID 5. Dilaudid 0.5-1 mg q 3 hours PRN  VTE prophylaxis: Lovenox  SCDs: in place RLE  ID: Clindamycin 900 mg post op  Foley/Lines:  No foley, KVO IVFs  Medical co-morbidities: None noted  Impediments  to Fracture Healing: Vitamin D level pending.  Start supplementation as indicated  Dispo: PT/OT eval, recommending CIR.  Okay for discharge to next venue once bed available  Follow - up plan: 2 weeks with Dr. Doreatha Martin for LLE splint removal/repeat x-rays. Follow up in 1 week with Dr. Griffin Basil for splint LUE removal   Contact information:  Katha Hamming MD, Patrecia Pace PA-C   Ria Redcay A. Carmie Kanner [facility] [phone] (office) [email or web address]

## 2019-06-07 NOTE — Progress Notes (Signed)
Physical Therapy Treatment Patient Details Name: Regina Goodman MRN: [id number] DOB: Mar 27, 1966 Today's Date: 06/07/2019    History of Present Illness Pt is a 54 y.o. female presenting to the ED after sustaining a fall, resulting in left wrist and ankle fxs. Pt is now s/p left wrist ORIF and left LE external fixation. No pertinent PMH.    PT Comments    Pt seen for mobility progression. Focus of session was on bed mobility, transfers and L LE strengthening therex. She continues to require physical assistance for transfers with use of L PFRW and is unable to take any hops on R LE for further gait training. Continue to recommend further intensive therapy services at CIR to maximize her independence with functional mobility prior to returning home with family support. If pt does not qualify or decides to d/c home instead, she will require a wheelchair with elevating leg rests, a RW, a platform attachment, a BSC and HHPT services.   Pt would continue to benefit from skilled physical therapy services at this time while admitted and after d/c to address the below listed limitations in order to improve overall safety and independence with functional mobility.    Follow Up Recommendations  CIR     Equipment Recommendations  Rolling walker with 5" wheels;3in1 (PT);Wheelchair (measurements PT);Wheelchair cushion (measurements PT);Other (comment)(platform attachment for RW; w/c with elevating leg rests)    Recommendations for Other Services       Precautions / Restrictions Precautions Precautions: Fall Restrictions Weight Bearing Restrictions: Yes LUE Weight Bearing: Weight bear through elbow only LLE Weight Bearing: Non weight bearing    Mobility  Bed Mobility Overal bed mobility: Needs Assistance Bed Mobility: Supine to Sit     Supine to sit: Min assist     General bed mobility comments: min A for initiation of L LE movement towards EOB  Transfers Overall transfer level: Needs  assistance Equipment used: Left platform walker Transfers: Sit to/from Stand;Stand Pivot Transfers Sit to Stand: Min assist Stand pivot transfers: Min assist       General transfer comment: pt performed sit<>stand x1 from EOB, x1 from recliner chair and x1 from [facility]. Pt able to pivot towards her R side and her L side this session with min A for stability and safety with RW management; cueing needed for safe hand placement  Ambulation/Gait             General Gait Details: pt still with great difficulty when attempting to hop - will need a w/c    Stairs             Wheelchair Mobility    Modified Rankin (Stroke Patients Only)       Balance Overall balance assessment: Needs assistance Sitting-balance support: Feet supported Sitting balance-Leahy Scale: Good     Standing balance support: Single extremity supported;Bilateral upper extremity supported Standing balance-Leahy Scale: Poor Standing balance comment: reliant on BUE support in standing while adhering to precautions                            Cognition Arousal/Alertness: Awake/alert Behavior During Therapy: WFL for tasks assessed/performed Overall Cognitive Status: Within Functional Limits for tasks assessed                                        Exercises General Exercises - Lower Extremity Long Arc  Quad: AROM;Strengthening;Left;10 reps;Seated Hip ABduction/ADduction: AAROM;Left;10 reps;Seated Hip Flexion/Marching: Seated;AROM;Strengthening;Left;10 reps    General Comments        Pertinent Vitals/Pain Pain Assessment: 0-10 Pain Score: 8  Pain Location: medial aspect of L ankle Pain Descriptors / Indicators: Throbbing Pain Intervention(s): Monitored during session;Repositioned    Home Living                      Prior Function            PT Goals (current goals can now be found in the care plan section) Acute Rehab PT Goals PT Goal Formulation: With  patient/family Time For Goal Achievement: 06/19/19 Potential to Achieve Goals: Good Progress towards PT goals: Progressing toward goals    Frequency    Min 5X/week      PT Plan Current plan remains appropriate    Co-evaluation              AM-PAC PT "6 Clicks" Mobility   Outcome Measure  Help needed turning from your back to your side while in a flat bed without using bedrails?: None Help needed moving from lying on your back to sitting on the side of a flat bed without using bedrails?: A Little Help needed moving to and from a bed to a chair (including a wheelchair)?: A Little Help needed standing up from a chair using your arms (e.g., wheelchair or bedside chair)?: None Help needed to walk in hospital room?: A Lot Help needed climbing 3-5 steps with a railing? : Total 6 Click Score: 17    End of Session Equipment Utilized During Treatment: Gait belt Activity Tolerance: Patient tolerated treatment well Patient left: in chair;with call bell/phone within reach Nurse Communication: Mobility status PT Visit Diagnosis: Other abnormalities of gait and mobility (R26.89);Pain Pain - Right/Left: Left Pain - part of body: Arm;Leg     Time: 7824-2353 PT Time Calculation (min) (ACUTE ONLY): 26 min  Charges:  $Therapeutic Activity: 23-37 mins                     Anastasio Champion, DPT  Acute Rehabilitation Services Pager [phone] Office [place] 06/07/2019, 10:37 AM

## 2019-06-07 NOTE — Progress Notes (Signed)
Occupational Therapy Treatment Patient Details Name: Regina Goodman MRN: [id number] DOB: [birth year] Today's Date: 06/07/2019    History of present illness Pt is a 54 y.o. female presenting to the ED after sustaining a fall, resulting in left wrist and ankle fxs. Pt is now s/p left wrist ORIF and left LE external fixation underwent removal of ex fix and ORIF on Lt LE 06/06/2019. No pertinent PMH.   OT comments  Pt is making excellent progress toward goals.  She is able to perform stand pivot transfers with min A, and was able to initiate hopping forward and back 2-3 steps with min A. She requires mod - max A for LB ADLs and min A for UB ADLs.  She demonstrates good awareness of precautions.  Feel she would benefit from CIR to allow her to return home at supervision - min A level with spouse.  Will follow acutely.   Follow Up Recommendations  CIR    Equipment Recommendations  3 in 1 bedside commode    Recommendations for Other Services Rehab consult    Precautions / Restrictions Precautions Precautions: Fall Restrictions Weight Bearing Restrictions: Yes LUE Weight Bearing: Weight bear through elbow only LLE Weight Bearing: Non weight bearing       Mobility Bed Mobility Overal bed mobility: Needs Assistance Bed Mobility: Supine to Sit     Supine to sit: Min guard     General bed mobility comments: no assistance required.  min guard for safety   Transfers Overall transfer level: Needs assistance Equipment used: Left platform walker Transfers: Sit to/from Stand;Stand Pivot Transfers Sit to Stand: Min assist Stand pivot transfers: Min assist       General transfer comment: requires assist for balance and to move sit to stand     Balance Overall balance assessment: Needs assistance Sitting-balance support: Feet supported Sitting balance-Leahy Scale: Good     Standing balance support: Single extremity supported;During functional activity Standing balance-Leahy Scale:  Poor Standing balance comment: requires UE support.  she was able to maintain static standing x 5 mins with min A                            ADL either performed or assessed with clinical judgement   ADL Overall ADL's : Needs assistance/impaired Eating/Feeding: Set up;Sitting                       Toilet Transfer: Minimal assistance;Stand-pivot;BSC;RW   Toileting- Clothing Manipulation and Hygiene: Minimal assistance;Sit to/from Nurse, children's Details (indicate cue type and reason): discussed options for shower chair with pt and spouse - they have access to fold down chair  Functional mobility during ADLs: Minimal assistance;Rolling walker General ADL Comments: requires assist for gown manipulation      Vision       Perception     Praxis      Cognition Arousal/Alertness: Awake/alert Behavior During Therapy: WFL for tasks assessed/performed Overall Cognitive Status: Within Functional Limits for tasks assessed                                          Exercises     Shoulder Instructions       General Comments Pt's spouse reports bathroom doorway is 23.25" wide.  Discussed reoommendation for handheld shower  Pertinent Vitals/ Pain       Pain Assessment: Faces Faces Pain Scale: Hurts little more Pain Location: Lt ankle Pain Descriptors / Indicators: Grimacing;Guarding;Discomfort Pain Intervention(s): Monitored during session  Home Living                                          Prior Functioning/Environment              Frequency  Min 2X/week        Progress Toward Goals  OT Goals(current goals can now be found in the care plan section)  Progress towards OT goals: Progressing toward goals  Acute Rehab OT Goals Patient Stated Goal: to go home  OT Goal Formulation: With patient Time For Goal Achievement: 06/21/19 Potential to Achieve Goals: Good ADL Goals Pt Will Perform  Tub/Shower Transfer: with min guard assist;ambulating;shower seat;rolling walker  Plan Discharge plan remains appropriate    Co-evaluation                 AM-PAC OT "6 Clicks" Daily Activity     Outcome Measure   Help from another person eating meals?: A Little Help from another person taking care of personal grooming?: A Little Help from another person toileting, which includes using toliet, bedpan, or urinal?: A Lot Help from another person bathing (including washing, rinsing, drying)?: A Lot Help from another person to put on and taking off regular upper body clothing?: A Little Help from another person to put on and taking off regular lower body clothing?: A Lot 6 Click Score: 15    End of Session Equipment Utilized During Treatment: Gait belt;Rolling walker  OT Visit Diagnosis: Unsteadiness on feet (R26.81);Other abnormalities of gait and mobility (R26.89);Pain Pain - Right/Left: Left Pain - part of body: Ankle and joints of foot;Arm   Activity Tolerance Patient tolerated treatment well   Patient Left in chair;with call bell/phone within reach   Nurse Communication Mobility status        Time: 0272-5366 OT Time Calculation (min): 30 min  Charges: OT General Charges $OT Visit: 1 Visit OT Treatments $Self Care/Home Management : 23-37 mins  Eber Jones OTR/L Acute Rehabilitation Services Pager [phone] Office [phone]    Jeani Hawking M 06/07/2019, 6:22 PM

## 2019-06-07 NOTE — Plan of Care (Signed)

## 2019-06-08 LAB — CBC
HCT: 29 % — ABNORMAL LOW (ref 36.0–46.0)
Hemoglobin: 9.6 g/dL — ABNORMAL LOW (ref 12.0–15.0)
MCH: 30 pg (ref 26.0–34.0)
MCHC: 33.1 g/dL (ref 30.0–36.0)
MCV: 90.6 fL (ref 80.0–100.0)
Platelets: 187 10*3/uL (ref 150–400)
RBC: 3.2 MIL/uL — ABNORMAL LOW (ref 3.87–5.11)
RDW: 13 % (ref 11.5–15.5)
WBC: 6.3 10*3/uL (ref 4.0–10.5)
nRBC: 0 % (ref 0.0–0.2)

## 2019-06-08 LAB — BASIC METABOLIC PANEL
Anion gap: 9 (ref 5–15)
BUN: 9 mg/dL (ref 6–20)
CO2: 28 mmol/L (ref 22–32)
Calcium: 8.4 mg/dL — ABNORMAL LOW (ref 8.9–10.3)
Chloride: 102 mmol/L (ref 98–111)
Creatinine, Ser: 0.67 mg/dL (ref 0.44–1.00)
GFR calc Af Amer: >60 mL/min (ref >60)
GFR calc non Af Amer: >60 mL/min (ref >60)
Glucose, Bld: 117 mg/dL — ABNORMAL HIGH (ref 70–99)
Potassium: 3.8 mmol/L (ref 3.5–5.1)
Sodium: 139 mmol/L (ref 135–145)

## 2019-06-08 NOTE — Progress Notes (Signed)
Physical Therapy Treatment Patient Details Name: Regina Goodman MRN: [id number] DOB: 1965/05/28 Today's Date: 06/08/2019    History of Present Illness Pt is a 54 y.o. female presenting to the ED after sustaining a fall, resulting in left wrist and ankle fxs. Pt is now s/p left wrist ORIF and left LE external fixation underwent removal of ex fix and ORIF on Lt LE 06/06/2019. No pertinent PMH.    PT Comments    Pt continuing to progress towards achieving her current functional mobility goals. Continue to recommend pt d/c to CIR for further intensive therapy services to maximize her independence with functional mobility prior to returning home with family support. If pt does not qualify or decides to d/c home instead, she will require a wheelchair with elevating leg rests, a RW, a platform attachment, a BSC and HHPT services.   Pt would continue to benefit from skilled physical therapy services at this time while admitted and after d/c to address the below listed limitations in order to improve overall safety and independence with functional mobility.      Follow Up Recommendations  CIR     Equipment Recommendations  Rolling walker with 5" wheels;3in1 (PT);Wheelchair (measurements PT);Wheelchair cushion (measurements PT);Other (comment)(platform attachment for RW; w/c with elevating leg rests)    Recommendations for Other Services       Precautions / Restrictions Precautions Precautions: Fall Restrictions Weight Bearing Restrictions: Yes LUE Weight Bearing: Weight bear through elbow only LLE Weight Bearing: Non weight bearing    Mobility  Bed Mobility Overal bed mobility: Needs Assistance Bed Mobility: Supine to Sit     Supine to sit: Min guard     General bed mobility comments: min guard for safety to achieve upright sitting at EOB towards pt's R side  Transfers Overall transfer level: Needs assistance Equipment used: Left platform walker Transfers: Sit to/from  Stand;Stand Pivot Transfers Sit to Stand: Min assist Stand pivot transfers: Min assist       General transfer comment: min A needed for safety and balance with transitional movements  Ambulation/Gait             General Gait Details: deferred as pt sitting on BSC attempting to have a BM   Stairs             Wheelchair Mobility    Modified Rankin (Stroke Patients Only)       Balance Overall balance assessment: Needs assistance Sitting-balance support: Feet supported Sitting balance-Leahy Scale: Good     Standing balance support: Single extremity supported;During functional activity Standing balance-Leahy Scale: Poor                              Cognition Arousal/Alertness: Awake/alert Behavior During Therapy: WFL for tasks assessed/performed Overall Cognitive Status: Within Functional Limits for tasks assessed                                        Exercises      General Comments        Pertinent Vitals/Pain Pain Assessment: Faces Faces Pain Scale: Hurts a little bit Pain Location: L medial ankle Pain Descriptors / Indicators: Sore;Tingling Pain Intervention(s): Monitored during session;Repositioned    Home Living                      Prior Function  PT Goals (current goals can now be found in the care plan section) Acute Rehab PT Goals PT Goal Formulation: With patient/family Time For Goal Achievement: 06/19/19 Potential to Achieve Goals: Good Progress towards PT goals: Progressing toward goals    Frequency    Min 5X/week      PT Plan Current plan remains appropriate    Co-evaluation              AM-PAC PT "6 Clicks" Mobility   Outcome Measure  Help needed turning from your back to your side while in a flat bed without using bedrails?: None Help needed moving from lying on your back to sitting on the side of a flat bed without using bedrails?: A Little Help needed moving  to and from a bed to a chair (including a wheelchair)?: A Little Help needed standing up from a chair using your arms (e.g., wheelchair or bedside chair)?: None Help needed to walk in hospital room?: A Lot Help needed climbing 3-5 steps with a railing? : Total 6 Click Score: 17    End of Session Equipment Utilized During Treatment: Gait belt Activity Tolerance: Patient tolerated treatment well Patient left: with call bell/phone within reach;Other (comment)(seated on BSC attempting to have a BM) Nurse Communication: Mobility status;Other (comment)(pt seated on BSC attempting to have a BM ) PT Visit Diagnosis: Other abnormalities of gait and mobility (R26.89);Pain Pain - Right/Left: Left Pain - part of body: Arm;Leg     Time: 1497-0263 PT Time Calculation (min) (ACUTE ONLY): 13 min  Charges:  $Therapeutic Activity: 8-22 mins                     Anastasio Champion, DPT  Acute Rehabilitation Services Pager [phone] Office [place] 06/08/2019, 12:19 PM

## 2019-06-08 NOTE — Progress Notes (Signed)
Occupational Therapy Treatment Patient Details Name: Regina Goodman MRN: [id number] DOB: Jan 05, 1966 Today's Date: 06/08/2019    History of present illness Pt is a 54 y.o. female presenting to the ED after sustaining a fall, resulting in left wrist and ankle fxs. Pt is now s/p left wrist ORIF and left LE external fixation underwent removal of ex fix and ORIF on Lt LE 06/06/2019. No pertinent PMH.   OT comments   Pt is making excellent progress in OT.  She performed bathing, dressing and toileting this session as well as used a shampoo cap to wash hair.  Talked further about sponge bathing initially as she doesn't have a tub bench. If she builds standing tolerance/endurance, she could stand at sink to wash hair.     Follow Up Recommendations  CIR    Equipment Recommendations    3:1 commode   Recommendations for Other Services      Precautions / Restrictions Precautions Precautions: Fall Restrictions Weight Bearing Restrictions: Yes LUE Weight Bearing: Weight bear through elbow only LLE Weight Bearing: Non weight bearing       Mobility Bed Mobility Overal bed mobility: Needs Assistance           General bed mobility comments: oob  Transfers Overall transfer level: Needs assistance Equipment used: Left platform walker Transfers: Sit to/from Stand;Stand Pivot Transfers Sit to Stand: Min assist Stand pivot transfers: Min assist       General transfer comment: min A needed for safety and balance with transitional movements    Balance Overall balance assessment: Needs assistance Sitting-balance support: Feet supported Sitting balance-Leahy Scale: Good     Standing balance support: Single extremity supported;During functional activity Standing balance-Leahy Scale: Poor Standing balance comment: NWB through L hand/wrist and leg                           ADL either performed or assessed with clinical judgement   ADL      Eating:  Pt set up her own lunch  tray     Upper Body Bathing: Minimal assistance;Sitting   Lower Body Bathing: Minimal assistance;Sitting/lateral leans   Upper Body Dressing : Minimal assistance   Lower Body Dressing: Maximal assistance Lower Body Dressing Details (indicate cue type and reason): simulated only. Pt has difficulty lifting LLE; educated on use of reacher, but pt would need assistance with it at this time. Toilet Transfer: Minimal assistance;Stand-pivot;BSC;RW   Toileting- Clothing Manipulation and Hygiene: Sitting/lateral lean;Minimal assistance;Moderate assistance(for hygiene, set up; clothes mod A )         General ADL Comments: performed ADL from chair then transferred to [facility] and back to chair maintaining NWB.  Min A for balance/steadiness.  Pt used shampoo cap.  Talked about standing at sink (kitchen) and using vegetable sprayer vs big cup of water rather than attempting tub transfer on folding chair.       Vision       Perception     Praxis      Cognition Arousal/Alertness: Awake/alert Behavior During Therapy: WFL for tasks assessed/performed Overall Cognitive Status: Within Functional Limits for tasks assessed                                          Exercises     Shoulder Instructions       General Comments pt has 3 dogs  at home:  80#, 40# and 13#.      Pertinent Vitals/ Pain       Pain Assessment: Faces Faces Pain Scale: Hurts a little bit Pain Location: L medial ankle Pain Descriptors / Indicators: Sore Pain Intervention(s): Monitored during session  Home Living                                          Prior Functioning/Environment              Frequency  Min 2X/week        Progress Toward Goals  OT Goals(current goals can now be found in the care plan section)  Progress towards OT goals: Progressing toward goals  Acute Rehab OT Goals Patient Stated Goal: to go home   Plan      Co-evaluation                  AM-PAC OT "6 Clicks" Daily Activity     Outcome Measure   Help from another person eating meals?: None Help from another person taking care of personal grooming?: A Little Help from another person toileting, which includes using toliet, bedpan, or urinal?: A Lot Help from another person bathing (including washing, rinsing, drying)?: A Little Help from another person to put on and taking off regular upper body clothing?: A Little Help from another person to put on and taking off regular lower body clothing?: A Lot 6 Click Score: 17    End of Session    OT Visit Diagnosis: Unsteadiness on feet (R26.81);Other abnormalities of gait and mobility (R26.89);Pain Pain - Right/Left: Left Pain - part of body: Ankle and joints of foot;Arm   Activity Tolerance     Patient Left     Nurse Communication          Time: 3762-8315 OT Time Calculation (min): 29 min  Charges: OT General Charges $OT Visit: 1 Visit OT Treatments $Self Care/Home Management : 23-37 mins  Vasti Yagi S, OTR/L Acute Rehabilitation Services 06/08/2019   Juwann Sherk 06/08/2019, 1:12 PM

## 2019-06-08 NOTE — Progress Notes (Signed)
Subjective: 2 Days Post-Op Procedure(s) (LRB): OPEN REDUCTION INTERNAL FIXATION (ORIF) TIBIA/FIBULA FRACTURE (Left) Patient reports pain as mild.    Objective: Vital signs in last 24 hours: Temp:  [98 F (36.7 C)-100.6 F (38.1 C)] 98 F (36.7 C) (03/13 0801) Pulse Rate:  [67-92] 91 (03/13 0801) Resp:  [16-18] 18 (03/13 0430) BP: (103-137)/(53-72) 137/72 (03/13 0801) SpO2:  [90 %-100 %] 90 % (03/13 0801)  Intake/Output from previous day: 03/12 0701 - 03/13 0700 In: 611.7 [P.O.:480; I.V.:31.7; IV Piggyback:100] Out: -  Intake/Output this shift: No intake/output data recorded.  Recent Labs    06/06/19 0439 06/07/19 0539 06/08/19 0259  HGB 10.1* 9.7* 9.6*   Recent Labs    06/07/19 0539 06/08/19 0259  WBC 6.4 6.3  RBC 3.23* 3.20*  HCT 29.8* 29.0*  PLT 187 187   Recent Labs    06/07/19 0539 06/08/19 0259  NA 140 139  K 3.5 3.8  CL 102 102  CO2 27 28  BUN 8 9  CREATININE 0.72 0.67  GLUCOSE 105* 117*  CALCIUM 8.6* 8.4*   No results for input(s): LABPT, INR in the last 72 hours.  LUE/LLE in splint well fitting clean dry and intact, wiggles toes, compartments soft   Assessment/Plan: 2 Days Post-Op Procedure(s) (LRB): OPEN REDUCTION INTERNAL FIXATION (ORIF) TIBIA/FIBULA FRACTURE (Left) Up with therapy NWB LLE, NWB left hand, may weight bear thru the elbow with platform walker which is in the room. Pain control as ordered lovenox dvt proph ABLA stable, monitor Awaiting IR consult, if not approved will need to coordinate [facility] assistance.   Pt lives with husband and two grown children, has 3 dogs one of which incidentally caused the injuries.    Regina Goodman 06/08/2019, 8:09 AM

## 2019-06-08 NOTE — Plan of Care (Signed)

## 2019-06-09 MED ORDER — ACETAMINOPHEN 325 MG PO TABS
650.0000 mg | ORAL_TABLET | Freq: Four times a day (QID) | ORAL | Status: DC | PRN
  Administered 2019-06-09 – 2019-06-11 (×7): 650 mg via ORAL
  Filled 2019-06-09 (×7): qty 2

## 2019-06-09 NOTE — Plan of Care (Signed)

## 2019-06-09 NOTE — Plan of Care (Signed)

## 2019-06-09 NOTE — Progress Notes (Signed)
Subjective: 3 Days Post-Op Procedure(s) (LRB): OPEN REDUCTION INTERNAL FIXATION (ORIF) TIBIA/FIBULA FRACTURE (Left) Patient reports pain as mild.  Still awaiting consult for Inpt Rehab.  Objective: Vital signs in last 24 hours: Temp:  [98.6 F (37 C)-99.6 F (37.6 C)] 99.2 F (37.3 C) (03/14 0805) Pulse Rate:  [78-98] 91 (03/14 0805) Resp:  [15-16] 16 (03/14 0805) BP: (104-117)/(58-66) 109/58 (03/14 0805) SpO2:  [96 %-99 %] 99 % (03/14 0805)  Intake/Output from previous day: 03/13 0701 - 03/14 0700 In: 720 [P.O.:720] Out: -  Intake/Output this shift: Total I/O In: 23.4 [I.V.:23.4] Out: -   Recent Labs    06/07/19 0539 06/08/19 0259  HGB 9.7* 9.6*   Recent Labs    06/07/19 0539 06/08/19 0259  WBC 6.4 6.3  RBC 3.23* 3.20*  HCT 29.8* 29.0*  PLT 187 187   Recent Labs    06/07/19 0539 06/08/19 0259  NA 140 139  K 3.5 3.8  CL 102 102  CO2 27 28  BUN 8 9  CREATININE 0.72 0.67  GLUCOSE 105* 117*  CALCIUM 8.6* 8.4*   No results for input(s): LABPT, INR in the last 72 hours.  Neurovascular intact Sensation intact distally Incision: dressing C/D/I   Assessment/Plan: 3 Days Post-Op Procedure(s) (LRB): OPEN REDUCTION INTERNAL FIXATION (ORIF) TIBIA/FIBULA FRACTURE (Left) Up with therapy NWB LLE, NWB left hand, may weight bear thru the elbow with platform walker which is in the room. Pain control as ordered lovenox dvt proph ABLA stable, monitor Awaiting IR consult, if not approved will need to coordinate [facility] assistance.   Pt lives with husband and two grown children, has 3 dogs one of which incidentally caused the injuries.   Regina Goodman 06/09/2019, 11:27 AM

## 2019-06-10 ENCOUNTER — Inpatient Hospital Stay (HOSPITAL_COMMUNITY): Payer: BC Managed Care – PPO

## 2019-06-10 NOTE — Progress Notes (Signed)
Orthopaedic Trauma Progress Note  S: Doing well, pain controlled. Continuing to progress toward goals with therapies. Awaiting inpatient rehab consult, she feels she would benefit from this more that going home with home health   O:  Vitals:   06/10/19 0529 06/10/19 0819  BP: (!) 108/56 109/69  Pulse: 76 82  Resp: 14 18  Temp: 98.4 F (36.9 C) 98.9 F (37.2 C)  SpO2: 97% 98%    General - Sitting up in bed, no acute distress Respiratory -  No increased work of breathing.  Left lower extremity - Splint in place, is clean, dry, intact.  Sensation intact to light touch of toes.  Wiggles toes.  Toes are warm and well perfused.   Left upper extremity - Splint clean, dry, intact.  Sensation and motor function intact to the radial, median, ulnar nerve distributions.  Full elbow motion.  Hand is warm and well-perfused.  Imaging: Postop imaging of left ankle stable. Repeat x-ray left wrist done this morning  Labs:  No results found for this or any previous visit (from the past 24 hour(s)).  Assessment: 54 year old female status post fall, 4 Days Post-Op   Injuries: 1. Left distal tibia fracture s/p removal of exfix and ORIF 2.  Left wrist fracture s/p ORIF by Dr. Everardo Pacific on 06/04/2019  Weightbearing: NWB LLE.  NWB left hand. WBAT L elbow  Insicional and dressing care: Leave splint on left upper and lower extremity until follow-up.  Keep splint clean and dry  Orthopedic device(s): Splint LUE, LLE  CV/Blood loss: Hgb  Stable. Hemodynamically stable  Pain management:  1. Tylenol 1000 mg q 8 hours scheduled 2. Robaxin 500 mg q 6 hours PRN 3. Oxycodone 5-15 mg q 4 hours PRN 4. Neurontin 100 mg TID 5. Dilaudid 0.5-1 mg q 3 hours PRN  VTE prophylaxis: Lovenox  SCDs: in place RLE  ID: Clindamycin 900 mg post op completed  Foley/Lines:  No foley, KVO IVFs  Medical co-morbidities: None noted  Impediments to Fracture Healing: Vitamin D level 20, start vit D3 supplementation  Dispo:  PT/OT eval, recommending CIR. Awaiting CIR consult, if not approved will need to coordinate [facility] assistance.   Follow - up plan: 2 weeks with Dr. Jena Gauss for LLE splint removal/repeat x-rays. Follow up in 1 week with Dr. Everardo Pacific for splint LUE removal   Contact information:  Truitt Merle MD, Ulyses Southward PA-C   Jeany Seville A. [facility] [phone] (office) [email or web address]

## 2019-06-10 NOTE — Plan of Care (Signed)

## 2019-06-10 NOTE — Progress Notes (Signed)
Inpatient Rehabilitation Admissions Coordinator  Inpatient rehab consult received. I met with patient at bedside for rehab assessment. We discussed goals and expectations of a  possible inpt rehab admit. Pt prefers CIR admit and then d/c home. I await updated PT and OT treatment notes from today and then will pursue insurance authorization for a possible admit pending insurance approval.  Danne Baxter, RN, MSN Rehab Admissions Coordinator [phone] 06/10/2019 11:27 AM

## 2019-06-10 NOTE — Progress Notes (Signed)
Occupational Therapy Treatment Patient Details Name: Regina Goodman MRN: [id number] DOB: [birth year] Today's Date: 06/10/2019    History of present illness Pt is a 54 y.o. female presenting to the ED after sustaining a fall, resulting in left wrist and ankle fxs. Pt is now s/p left wrist ORIF and left LE external fixation underwent removal of ex fix and ORIF on Lt LE 06/06/2019. No pertinent PMH.   OT comments  Pt requesting to take a shower and wash hair upon OT arrival. PA arrived at start of session and provided order for pt to take a shower. RN approved as well. Therapist wrapped LUE an LLE in waterproof bags. Pt required minguard for ambulation into bathroom at platform walker level, maintaining WB precautions. Pt required minA for thorough hair care. Educated pt on donning LLE and LUE first and doffing RUE and RLE first. Pt able to return demonstrate dressing at supervision level with minguard for stability. Pt will continue to benefit from skilled OT services to maximize safety and independence with ADL/IADL and functional mobility. Will continue to follow acutely and progress as tolerated.    Follow Up Recommendations  CIR    Equipment Recommendations  3 in 1 bedside commode    Recommendations for Other Services Rehab consult    Precautions / Restrictions Precautions Precautions: Fall Precaution Comments: external fixator L LE Restrictions Weight Bearing Restrictions: Yes LUE Weight Bearing: Weight bear through elbow only LLE Weight Bearing: Non weight bearing Other Position/Activity Restrictions: Per ortho note       Mobility Bed Mobility Overal bed mobility: Needs Assistance Bed Mobility: Sit to Supine     Supine to sit: Supervision        Transfers Overall transfer level: Needs assistance Equipment used: Left platform walker Transfers: Sit to/from Stand Sit to Stand: Min guard         General transfer comment: minguard for safety    Balance Overall  balance assessment: Needs assistance Sitting-balance support: Feet supported Sitting balance-Leahy Scale: Good     Standing balance support: Bilateral upper extremity supported Standing balance-Leahy Scale: Poor Standing balance comment: NWB through L hand/wrist and leg                           ADL either performed or assessed with clinical judgement   ADL Overall ADL's : Needs assistance/impaired     Grooming: Minimal assistance Grooming Details (indicate cue type and reason): minA for thorough washing hair         Upper Body Dressing : Supervision/safety Upper Body Dressing Details (indicate cue type and reason): educated pt on proper donning/doffing clothing Lower Body Dressing: Min guard;Sit to/from stand Lower Body Dressing Details (indicate cue type and reason): educated pt on proper donning/doffing clothing Toilet Transfer: Min guard;RW;Ambulation   Toileting- Architect and Hygiene: Sit to/from stand;Min guard   Tub/ Engineer, structural: Psychologist, counselling;Shower seat;Min Psychologist, prison and probation services Details (indicate cue type and reason): pt sat while showering Functional mobility during ADLs: Min guard;Rolling walker General ADL Comments: PA approved pt to shower and entered order;pt required minguard for functional mobility at RW level and minA for washing hair;     Vision       Perception     Praxis      Cognition Arousal/Alertness: Awake/alert Behavior During Therapy: WFL for tasks assessed/performed Overall Cognitive Status: Within Functional Limits for tasks assessed  Exercises     Shoulder Instructions       General Comments vss    Pertinent Vitals/ Pain       Pain Assessment: Faces Faces Pain Scale: Hurts a little bit Pain Location: L medial ankle Pain Descriptors / Indicators: Aching;Guarding Pain Intervention(s): Limited activity within patient's tolerance;Monitored  during session  Home Living                                          Prior Functioning/Environment              Frequency  Min 2X/week        Progress Toward Goals  OT Goals(current goals can now be found in the care plan section)  Progress towards OT goals: Progressing toward goals  Acute Rehab OT Goals Patient Stated Goal: to go home  OT Goal Formulation: With patient Time For Goal Achievement: 06/21/19 Potential to Achieve Goals: Good ADL Goals Pt Will Perform Grooming: with modified independence Pt Will Perform Upper Body Dressing: with modified independence;sitting Pt Will Perform Lower Body Dressing: with supervision;sit to/from stand Pt Will Transfer to Toilet: with min assist;ambulating Pt Will Perform Tub/Shower Transfer: with min guard assist;ambulating;shower seat;rolling walker  Plan Discharge plan remains appropriate    Co-evaluation                 AM-PAC OT "6 Clicks" Daily Activity     Outcome Measure   Help from another person eating meals?: None Help from another person taking care of personal grooming?: A Little Help from another person toileting, which includes using toliet, bedpan, or urinal?: A Little Help from another person bathing (including washing, rinsing, drying)?: A Little Help from another person to put on and taking off regular upper body clothing?: A Little Help from another person to put on and taking off regular lower body clothing?: A Little 6 Click Score: 19    End of Session Equipment Utilized During Treatment: Gait belt;Rolling walker  OT Visit Diagnosis: Unsteadiness on feet (R26.81);Other abnormalities of gait and mobility (R26.89);Pain Pain - Right/Left: Left Pain - part of body: Ankle and joints of foot;Arm   Activity Tolerance Patient tolerated treatment well   Patient Left in bed;with call bell/phone within reach;with bed alarm set   Nurse Communication Mobility status        Time:  6789-3810 OT Time Calculation (min): 41 min  Charges: OT General Charges $OT Visit: 1 Visit OT Treatments $Self Care/Home Management : 38-52 mins  Helene Kelp OTR/L Acute Rehabilitation Services Office: [place] 06/10/2019, 4:53 PM

## 2019-06-10 NOTE — Progress Notes (Signed)
   ORTHOPAEDIC PROGRESS NOTE  s/p Procedure(s):  OPEN REDUCTION INTERNAL FIXATION (ORIF) TIBIA/FIBULA FRACTURE on 06/06/2019 by Dr. Jena Gauss OPEN REDUCTION INTERNAL FIXATION (ORIF) WRIST on 06/04/2019 by Dr. Everardo Pacific   SUBJECTIVE: Patient is doing well. Working with therapy. Waiting for inpatient rehab consult. No chest pain. No SOB. No nausea/vomiting. No other complaints.  OBJECTIVE: PE: Left upper extremity: Splint removed . Incision CDI - steri-strips in place. + Motor in  AIN, PIN, Ulnar distributions. Sensation intact in medial, radial, and ulnar distributions. Well perfused digits.  Left lower extremity: splint CDI, endorses distal sensation, wiggles her toes, warm well perfused foot   Vitals:   06/10/19 0819 06/10/19 1300  BP: 109/69 122/67  Pulse: 82 97  Resp: 18 18  Temp: 98.9 F (37.2 C) 98.5 F (36.9 C)  SpO2: 98% 99%   Imaging: Stable post-op imaging  ASSESSMENT: Regina Goodman is a 54 y.o. female doing well postoperatively.  PLAN: Weightbearing:  - LUE: 1 pound weight limit hand/wrist, ROM as tolerated, WBAT L elbow - LLE: NWB Insicional and dressing care:  - LUE: splint removed, incision can be left open to air, reinforced with dry gauze as needed - LLE: Keep splint clean and dry Orthopedic device(s): - LUE: removable wrist splint - LLE: splint Showering: With assistance. Keep LLE splint dry.  VTE prophylaxis: Lovenox Pain control: PRN pain medications, preferring oral medications.  Follow - up plan: 5 weeks in office with Dr. Salomon Fick information:  Dr. Alphonsa Gin PA-C  Dispo: PT/OT eval, recommending CIR. Awaiting CIR consult, if not approved will need to coordinate [facility] assistance.   Alfonse Alpers, PA-C 06/10/2019

## 2019-06-10 NOTE — Progress Notes (Signed)
Physical Therapy Treatment Patient Details Name: Regina Goodman MRN: [id number] DOB: Oct 19, 1965 Today's Date: 06/10/2019    History of Present Illness Pt is a 54 y.o. female presenting to the ED after sustaining a fall, resulting in left wrist and ankle fxs. Pt is now s/p left wrist ORIF and left LE external fixation underwent removal of ex fix and ORIF on Lt LE 06/06/2019. No pertinent PMH.    PT Comments    Pt very pleasant and eager to attempt gait this session. Pt continues to progress well with activity and limited by LUE and LLE NWB status with good use of platform RW. Pt fatigues quickly with hopping and educated for self monitoring and gradual progression. Pt continues to be appropriate for CIR given large dogs at home that led to fall, limited activity and NWB left side. Will continue to follow to progress gait, function and stairs.     Follow Up Recommendations  CIR     Equipment Recommendations  Rolling walker with 5" wheels;3in1 (PT)    Recommendations for Other Services       Precautions / Restrictions Precautions Precautions: Fall Restrictions LUE Weight Bearing: Weight bear through elbow only LLE Weight Bearing: Non weight bearing    Mobility  Bed Mobility   Bed Mobility: Supine to Sit     Supine to sit: Min guard;HOB elevated     General bed mobility comments: HOB 35 degrees with increased time and able to perform sequential scoots to pivot to EOB. assist to don sock/shoe EOB  Transfers Overall transfer level: Needs assistance   Transfers: Sit to/from Stand Sit to Stand: Min guard         General transfer comment: pt able to stand with guarding for safety and equipment to stand from bed x 2 trials with maintained weight bearing status.  Ambulation/Gait Ambulation/Gait assistance: Min assist Gait Distance (Feet): 40 Feet Assistive device: Left platform walker Gait Pattern/deviations: Step-to pattern   Gait velocity interpretation: <1.8 ft/sec,  indicate of risk for recurrent falls General Gait Details: pt able to hop 20' then additional 37' with maintained NWB. Pt with cues for sequence and safety. Pt eager to progress mobility and at times needs cues to self regulate activity as fatigues quickly with hopping.   Stairs             Wheelchair Mobility    Modified Rankin (Stroke Patients Only)       Balance Overall balance assessment: Needs assistance   Sitting balance-Leahy Scale: Good     Standing balance support: Bilateral upper extremity supported Standing balance-Leahy Scale: Poor Standing balance comment: NWB through L hand/wrist and leg                            Cognition Arousal/Alertness: Awake/alert Behavior During Therapy: WFL for tasks assessed/performed Overall Cognitive Status: Within Functional Limits for tasks assessed                                        Exercises General Exercises - Lower Extremity Long Arc Quad: AROM;Strengthening;Left;Seated;15 reps Hip Flexion/Marching: Seated;AROM;Strengthening;Left;15 reps    General Comments        Pertinent Vitals/Pain Pain Score: 5  Pain Location: L medial ankle Pain Descriptors / Indicators: Aching;Guarding Pain Intervention(s): Limited activity within patient's tolerance;Premedicated before session;Repositioned;Monitored during session    Home Living  Prior Function            PT Goals (current goals can now be found in the care plan section) Progress towards PT goals: Progressing toward goals    Frequency    Min 5X/week      PT Plan Current plan remains appropriate    Co-evaluation              AM-PAC PT "6 Clicks" Mobility   Outcome Measure  Help needed turning from your back to your side while in a flat bed without using bedrails?: None Help needed moving from lying on your back to sitting on the side of a flat bed without using bedrails?: A Little Help  needed moving to and from a bed to a chair (including a wheelchair)?: A Little Help needed standing up from a chair using your arms (e.g., wheelchair or bedside chair)?: None Help needed to walk in hospital room?: A Little Help needed climbing 3-5 steps with a railing? : A Lot 6 Click Score: 19    End of Session   Activity Tolerance: Patient tolerated treatment well Patient left: in chair;with call bell/phone within reach Nurse Communication: Mobility status PT Visit Diagnosis: Other abnormalities of gait and mobility (R26.89);Pain     Time: 0086-7619 PT Time Calculation (min) (ACUTE ONLY): 26 min  Charges:  $Gait Training: 8-22 mins $Therapeutic Activity: 8-22 mins                     Courtlyn Aki P, PT Acute Rehabilitation Services Pager: [phone] Office: [place] 06/10/2019, 11:43 AM

## 2019-06-10 NOTE — Progress Notes (Signed)
Orthopedic Tech Progress Note Patient Details:  Regina Goodman Apr 05, 1965 [id number]  Ortho Devices Type of Ortho Device: Wrist splint Ortho Device/Splint Location: left Ortho Device/Splint Interventions: Application   Post Interventions Patient Tolerated: Well Instructions Provided: Care of device   Saul Fordyce 06/10/2019, 6:12 PM

## 2019-06-11 LAB — CBC
HCT: 34.5 % — ABNORMAL LOW (ref 36.0–46.0)
Hemoglobin: 11.3 g/dL — ABNORMAL LOW (ref 12.0–15.0)
MCH: 30.7 pg (ref 26.0–34.0)
MCHC: 32.8 g/dL (ref 30.0–36.0)
MCV: 93.8 fL (ref 80.0–100.0)
Platelets: 346 10*3/uL (ref 150–400)
RBC: 3.68 MIL/uL — ABNORMAL LOW (ref 3.87–5.11)
RDW: 13.2 % (ref 11.5–15.5)
WBC: 6.9 10*3/uL (ref 4.0–10.5)
nRBC: 0 % (ref 0.0–0.2)

## 2019-06-11 LAB — CREATININE, SERUM
Creatinine, Ser: 0.72 mg/dL (ref 0.44–1.00)
GFR calc Af Amer: >60 mL/min (ref >60)
GFR calc non Af Amer: >60 mL/min (ref >60)

## 2019-06-11 MED ORDER — VITAMIN D-3 125 MCG (5000 UT) PO TABS: 5000.0000 [IU] | ORAL_TABLET | Freq: Every day | ORAL | 1 refills | Status: AC

## 2019-06-11 MED ORDER — HYDROCODONE-ACETAMINOPHEN 5-325 MG PO TABS: 1.0000 | ORAL_TABLET | Freq: Three times a day (TID) | ORAL | 0 refills | Status: AC | PRN

## 2019-06-11 MED ORDER — ENOXAPARIN SODIUM 40 MG/0.4ML ~~LOC~~ SOLN: 40.0000 mg | SUBCUTANEOUS | 0 refills | Status: AC

## 2019-06-11 NOTE — Progress Notes (Signed)
Physical Therapy Treatment Patient Details Name: Regina Goodman MRN: [id number] DOB: 1965/12/09 Today's Date: 06/11/2019    History of Present Illness Pt is a 54 y.o. female presenting to the ED after sustaining a fall, resulting in left wrist and ankle fxs. Pt is now s/p left wrist ORIF and left LE external fixation underwent removal of ex fix and ORIF on Lt LE 06/06/2019. No pertinent PMH.    PT Comments    Pt pleasant and reports having not slept as well but no increased pain after moving and hopping today and yesterday. Pt fatigued after short bouts of gait and toileting this session. Pt continues to need cues and for safety and sequence with dressing, gait and stairs but continues to improve. Will continue to follow to maximize independence, balance and activity tolerance.     Follow Up Recommendations  CIR     Equipment Recommendations  Rolling walker with 5" wheels;3in1 (PT);Other (comment)(left platform)    Recommendations for Other Services       Precautions / Restrictions Precautions Precautions: Fall Restrictions Weight Bearing Restrictions: Yes LUE Weight Bearing: Weight bear through elbow only LLE Weight Bearing: Non weight bearing    Mobility  Bed Mobility Overal bed mobility: Modified Independent             General bed mobility comments: HOB 20 degrees with increased time  Transfers Overall transfer level: Needs assistance   Transfers: Sit to/from Stand Sit to Stand: Min guard         General transfer comment: minguard for safety from bed, toilet and chair x 2  Ambulation/Gait Ambulation/Gait assistance: Min guard Gait Distance (Feet): 15 Feet Assistive device: Left platform walker Gait Pattern/deviations: Step-to pattern   Gait velocity interpretation: 1.31 - 2.62 ft/sec, indicative of limited community ambulator General Gait Details: pt hopped 15' x 2 to and from bathroom, ride in chair to gym then additional 8' x 2 trials, cues for safety  and sequence   Stairs Stairs: Yes Stairs assistance: Min assist Stair Management: Backwards;Step to pattern;With walker Number of Stairs: 2 General stair comments: assist to stedy and move RW. Pt needs additional UE support during RW transition on stairs. handout provided   Wheelchair Mobility    Modified Rankin (Stroke Patients Only)       Balance Overall balance assessment: Needs assistance   Sitting balance-Leahy Scale: Good     Standing balance support: Single extremity supported Standing balance-Leahy Scale: Fair Standing balance comment: NWB through L hand/wrist and leg, able to stand to brush teeth                            Cognition Arousal/Alertness: Awake/alert Behavior During Therapy: WFL for tasks assessed/performed Overall Cognitive Status: Within Functional Limits for tasks assessed                                        Exercises      General Comments        Pertinent Vitals/Pain Pain Score: 2  Pain Location: L medial ankle Pain Descriptors / Indicators: Sore Pain Intervention(s): Limited activity within patient's tolerance;Monitored during session;Premedicated before session;Repositioned    Home Living                      Prior Function  PT Goals (current goals can now be found in the care plan section) Progress towards PT goals: Progressing toward goals    Frequency    Min 5X/week      PT Plan Current plan remains appropriate    Co-evaluation              AM-PAC PT "6 Clicks" Mobility   Outcome Measure  Help needed turning from your back to your side while in a flat bed without using bedrails?: None Help needed moving from lying on your back to sitting on the side of a flat bed without using bedrails?: A Little Help needed moving to and from a bed to a chair (including a wheelchair)?: A Little Help needed standing up from a chair using your arms (e.g., wheelchair or  bedside chair)?: A Little Help needed to walk in hospital room?: A Little Help needed climbing 3-5 steps with a railing? : A Little 6 Click Score: 19    End of Session Equipment Utilized During Treatment: Gait belt Activity Tolerance: Patient tolerated treatment well Patient left: in chair;with call bell/phone within reach Nurse Communication: Mobility status;Precautions PT Visit Diagnosis: Other abnormalities of gait and mobility (R26.89);Difficulty in walking, not elsewhere classified (R26.2)     Time: 0720-0758 PT Time Calculation (min) (ACUTE ONLY): 38 min  Charges:  $Gait Training: 23-37 mins $Therapeutic Activity: 8-22 mins                     Fayelynn Distel P, PT Acute Rehabilitation Services Pager: [phone] Office: [place] 06/11/2019, 12:39 PM

## 2019-06-11 NOTE — Progress Notes (Signed)
   ORTHOPAEDIC PROGRESS NOTE  s/p Procedure(s):  OPEN REDUCTION INTERNAL FIXATION (ORIF) TIBIA/FIBULA FRACTURE on 06/06/2019 by Dr. Jena Gauss OPEN REDUCTION INTERNAL FIXATION (ORIF) WRIST on 06/04/2019 by Dr. Everardo Pacific   SUBJECTIVE: Patient is doing well. Working with therapy. She is going home today with home health. No chest pain. No SOB. No nausea/vomiting. No other complaints.  OBJECTIVE: PE: Left upper extremity: Removable wrist splint in place. Incision CDI - steri-strips in place. + Motor in  AIN, PIN, Ulnar distributions. Sensation intact in medial, radial, and ulnar distributions. Well perfused digits.  Left lower extremity: splint CDI, endorses distal sensation, wiggles her toes, warm well perfused foot   Vitals:   06/11/19 0331 06/11/19 1311  BP: (!) 99/54 (!) 114/30  Pulse: 84 100  Resp: 17 20  Temp: 98.3 F (36.8 C) 99.4 F (37.4 C)  SpO2: 99% 99%   Imaging: Stable post-op imaging  ASSESSMENT: Regina Goodman is a 54 y.o. female doing well postoperatively.  PLAN: Weightbearing:  - LUE: 1 pound weight limit hand/wrist, ROM as tolerated, WBAT L elbow - LLE: NWB Insicional and dressing care:  - LUE: splint removed, incision can be left open to air, reinforced with dry gauze as needed - LLE: Keep splint clean and dry Orthopedic device(s): - LUE: removable wrist splint - LLE: splint Showering: With assistance. Keep LLE splint dry.  VTE prophylaxis: Lovenox Pain control: PRN pain medications, preferring oral medications.  Follow - up plan: 5 weeks in office with Dr. Everardo Pacific. 2 weeks with Dr. Samson Frederic information:  Dr. Everardo Pacific, Alfonse Alpers PA-C  Dispo: Discharging home with home health.   Alfonse Alpers, PA-C 06/11/2019

## 2019-06-11 NOTE — Progress Notes (Signed)
Inpatient Rehabilitation Admissions Coordinator  I have received a denial from [facility] for CIR admit. Patient made aware and wishes to d/c today. I have notified RN CM , Marcelino Duster for [facility] and DME needs. We will sign off at this time.  Ottie Glazier, RN, MSN Rehab Admissions Coordinator [phone] 06/11/2019 3:23 PM

## 2019-06-11 NOTE — Progress Notes (Signed)
Orthopaedic Trauma Progress Note  S: Doing well, pain controlled. No complaints. Continuing to progress toward goals with therapies. Awaiting insurance approval for inpatient rehab, hopeful to transition there soon.   O:  Vitals:   06/10/19 1952 06/11/19 0331  BP: 98/67 (!) 99/54  Pulse: 91 84  Resp: 17 17  Temp: 100.1 F (37.8 C) 98.3 F (36.8 C)  SpO2: 100% 99%    General - Sitting up in bed, no acute distress Respiratory -  No increased work of breathing.  Left lower extremity - Splint in place, is clean, dry, intact. Non-tender above splint. Sensation intact to light touch of toes.  Wiggles toes.  Toes are warm and well perfused.   Left upper extremity - Removable wrist splint in place.  Sensation and motor function intact to the radial, median, ulnar nerve distributions.  Full elbow motion.  Hand is warm and well-perfused.  Imaging: Stable post op imaging left wrist, left ankle  Labs:  Results for orders placed or performed during the hospital encounter of 06/04/19 (from the past 24 hour(s))  Creatinine, serum     Status: None   Collection Time: 06/11/19  3:39 AM  Result Value Ref Range   Creatinine, Ser 0.72 0.44 - 1.00 mg/dL   GFR calc non Af Amer >60 >60 mL/min   GFR calc Af Amer >60 >60 mL/min  CBC     Status: Abnormal   Collection Time: 06/11/19  3:39 AM  Result Value Ref Range   WBC 6.9 4.0 - 10.5 K/uL   RBC 3.68 (L) 3.87 - 5.11 MIL/uL   Hemoglobin 11.3 (L) 12.0 - 15.0 g/dL   HCT 35.0 (L) 09.3 - 81.8 %   MCV 93.8 80.0 - 100.0 fL   MCH 30.7 26.0 - 34.0 pg   MCHC 32.8 30.0 - 36.0 g/dL   RDW 29.9 37.1 - 69.6 %   Platelets 346 150 - 400 K/uL   nRBC 0.0 0.0 - 0.2 %    Assessment: 54 year old female status post fall, 5 Days Post-Op   Injuries: 1. Left distal tibia fracture s/p removal of exfix and ORIF 2.  Left wrist fracture s/p ORIF by Dr. Everardo Pacific on 06/04/2019  Weightbearing: NWB LLE.  1 pound weight limit L hand/wrist. WBAT L elbow  Insicional and dressing  care: Leave splint on left lower extremity until follow-up.  Keep splint clean and dry. Dry dressing PRN LUE  Showering: Okay to shower with assistance, keep LLE splint dry  Orthopedic device(s): Splint LLLE, removable wrist splint LUE  CV/Blood loss: Hgb  Stable. Hemodynamically stable  Pain management:  1. Tylenol 1000 mg q 8 hours scheduled 2. Robaxin 500 mg q 6 hours PRN 3. Oxycodone 5-15 mg q 4 hours PRN 4. Neurontin 100 mg TID 5. Dilaudid 0.5-1 mg q 3 hours PRN  VTE prophylaxis: Lovenox  SCDs: in place RLE  ID: Clindamycin 900 mg post op completed  Foley/Lines:  No foley, KVO IVFs  Medical co-morbidities: None noted  Impediments to Fracture Healing: Vitamin D level 20, continue vit D3 supplementation  Dispo: Up with therapies as tolerated, awaiting insurance approval for CIR. Okay for discharge when approval received and bed available  Follow - up plan: Continue to follow while in hospital. Follow-up as outpatient  2 weeks after hospital discharge with Dr. Jena Gauss. Follow up with Dr. Everardo Pacific in 5 weeks  Contact information:  Truitt Merle MD, Ulyses Southward PA-C   Brunette Lavalle A[facility] [phone] (office) [email or web address]

## 2019-06-11 NOTE — TOC Transition Note (Addendum)
Transition of Care [facility]) - CM/SW Discharge Note   Patient Details  Name: Devlynn Knoff MRN: [id number] Date of Birth: Jun 28, 1965  Transition of Care [facility]) CM/SW Contact:  Janae Bridgeman, RN Phone Number: 06/11/2019, 3:59 PM   Clinical Narrative:      Pt is a 54 y.o. female presenting to the ED after sustaining a fall, resulting in left wrist and ankle fxs. Pt is now s/p left wrist ORIF and left LE external fixation underwent removal of ex fix and ORIF on Lt LE 06/06/2019. No pertinent PMH.  Patient was denied by Insurance for [employer].  Talked with the patient and Choice offered for Home Health.  Patient did not have a preference, [facility] called and waiting to hear back.  [employer] called and 3:1 and rolling walker with platform to be delivered to the room.  Husband or daughter to transport the patient home.  Patient uses [facility] [address] And PCP is [facility] on [address].  3/16- 1619 - [facility] unable to provide PT/OT until beginning of next week.  Called [place], Kandee Keen and they are providing PT/OT for [facility].     Final next level of care: IP Rehab Facility Barriers to Discharge: No Barriers Identified, Continued Medical Work up   Patient Goals and CMS Choice Patient states their goals for this hospitalization and ongoing recovery are:: I'm ready to have my surgery this afternoon to have my ankle fixed.      Discharge Placement                       Discharge Plan and Services                                     Social Determinants of Health (SDOH) Interventions     Readmission Risk Interventions Readmission Risk Prevention Plan 06/11/2019  Post Dischage Appt Complete  Medication Screening Complete  Transportation Screening Complete  Some recent data might be hidden

## 2019-06-11 NOTE — Progress Notes (Signed)
Discharge summary given to pt, explained and educated pt. Verbalized understanding of instructions. Alert/oriented in no apparent distress, no complaints voiced. Family is responsible for pt's transport. Discharge to home as ordered.

## 2019-06-11 NOTE — Discharge Summary (Signed)
Orthopaedic Trauma Service (OTS) Discharge Summary   Patient ID: Regina Goodman MRN: [id number] DOB/AGE: 1965/06/20 54 y.o.  Admit date: 06/04/2019 Discharge date: 06/11/2019  Admission Diagnoses: 1.  Left distal radius fracture 2.  Left pilon fracture  Discharge Diagnoses:  Principal Problem:   Fracture of tibial shaft, closed Active Problems:   Closed fracture of left distal radius   Closed left pilon fracture, initial encounter   Fall   History reviewed. No pertinent past medical history.   Procedures Performed: 1.  Open reduction internal fixation left distal radius fracture 2. Placement/removal of external fixatator left ankle 3. Open reduction internal fixation of left pilon fracture 4. Open reduction internal fixation of left tibial shaft fracture  Discharged Condition: [facility] Course: Patient presented to [facility] emergency department on 06/04/2019 after sustaining a fall.  Was found to have a left distal radius fracture and left ankle fracture.  Orthopedics was consulted.  Patient taking to operating room by Dr. Everardo Pacific on 06/04/2019 for ORIF of left wrist external fixation of left ankle.  Patient tolerated these procedures well without complications.  Was placed in a volar wrist splint.  Was started on Lovenox for DVT prophylaxis on postoperative day #1.  Began working with Physical and Occupational Therapy starting on postoperative day #1.  Was taken back to the operating room by Dr. Jena Gauss on 06/06/2019 for removal of external fixator and open reduction internal fixation of the left pilon fracture.  Patient tolerated this procedure well without complications.  Was placed in a short leg splint postoperatively.  Again, patient was started on Lovenox for DVT prophylaxis on postoperative day #1.  Began working with physical and occupational therapy to increase mobility and strength.  Repeat imaging of the left wrist performed on 06/10/2019 fracture to be stable.   Patient was transitioned to removable wrist splint at this time.  [facility] inpatient rehab was consulted for admission for comprehensive rehab program, but patient was denied by insurance. On 06/11/2019, the patient was tolerating diet, working well with therapies, pain well controlled, vital signs stable, dressings clean, dry, intact and felt stable for discharge to  home with home health therapies. Patient will follow up as below and knows to call with questions or concerns.     Consults: rehabilitation medicine  Significant Diagnostic Studies:  Results for orders placed or performed during the hospital encounter of 06/04/19 (from the past 168 hour(s))  Surgical pcr screen   Collection Time: 06/04/19  5:09 PM   Specimen: Nasal Mucosa; Nasal Swab  Result Value Ref Range   MRSA, PCR NEGATIVE NEGATIVE   Staphylococcus aureus NEGATIVE NEGATIVE  HIV Antibody (routine testing w rflx)   Collection Time: 06/05/19  6:18 AM  Result Value Ref Range   HIV Screen 4th Generation wRfx NON REACTIVE NON REACTIVE  CBC   Collection Time: 06/05/19  6:18 AM  Result Value Ref Range   WBC 9.2 4.0 - 10.5 K/uL   RBC 3.52 (L) 3.87 - 5.11 MIL/uL   Hemoglobin 10.7 (L) 12.0 - 15.0 g/dL   HCT 16.6 (L) 06.3 - 01.6 %   MCV 91.5 80.0 - 100.0 fL   MCH 30.4 26.0 - 34.0 pg   MCHC 33.2 30.0 - 36.0 g/dL   RDW 01.0 93.2 - 35.5 %   Platelets 219 150 - 400 K/uL   nRBC 0.0 0.0 - 0.2 %  Basic metabolic panel   Collection Time: 06/05/19  6:18 AM  Result Value Ref Range   Sodium  137 135 - 145 mmol/L   Potassium 3.7 3.5 - 5.1 mmol/L   Chloride 101 98 - 111 mmol/L   CO2 25 22 - 32 mmol/L   Glucose, Bld 135 (H) 70 - 99 mg/dL   BUN 12 6 - 20 mg/dL   Creatinine, Ser 3.66 0.44 - 1.00 mg/dL   Calcium 8.8 (L) 8.9 - 10.3 mg/dL   GFR calc non Af Amer >60 >60 mL/min   GFR calc Af Amer >60 >60 mL/min   Anion gap 11 5 - 15  CBC   Collection Time: 06/06/19  4:39 AM  Result Value Ref Range   WBC 5.9 4.0 - 10.5 K/uL   RBC 3.37  (L) 3.87 - 5.11 MIL/uL   Hemoglobin 10.1 (L) 12.0 - 15.0 g/dL   HCT 29.4 (L) 76.5 - 46.5 %   MCV 92.9 80.0 - 100.0 fL   MCH 30.0 26.0 - 34.0 pg   MCHC 32.3 30.0 - 36.0 g/dL   RDW 03.5 46.5 - 68.1 %   Platelets 186 150 - 400 K/uL   nRBC 0.0 0.0 - 0.2 %  Basic metabolic panel   Collection Time: 06/06/19  4:39 AM  Result Value Ref Range   Sodium 141 135 - 145 mmol/L   Potassium 3.8 3.5 - 5.1 mmol/L   Chloride 104 98 - 111 mmol/L   CO2 28 22 - 32 mmol/L   Glucose, Bld 99 70 - 99 mg/dL   BUN 8 6 - 20 mg/dL   Creatinine, Ser 2.75 0.44 - 1.00 mg/dL   Calcium 8.5 (L) 8.9 - 10.3 mg/dL   GFR calc non Af Amer >60 >60 mL/min   GFR calc Af Amer >60 >60 mL/min   Anion gap 9 5 - 15  CBC   Collection Time: 06/07/19  5:39 AM  Result Value Ref Range   WBC 6.4 4.0 - 10.5 K/uL   RBC 3.23 (L) 3.87 - 5.11 MIL/uL   Hemoglobin 9.7 (L) 12.0 - 15.0 g/dL   HCT 17.0 (L) 01.7 - 49.4 %   MCV 92.3 80.0 - 100.0 fL   MCH 30.0 26.0 - 34.0 pg   MCHC 32.6 30.0 - 36.0 g/dL   RDW 49.6 75.9 - 16.3 %   Platelets 187 150 - 400 K/uL   nRBC 0.0 0.0 - 0.2 %  Basic metabolic panel   Collection Time: 06/07/19  5:39 AM  Result Value Ref Range   Sodium 140 135 - 145 mmol/L   Potassium 3.5 3.5 - 5.1 mmol/L   Chloride 102 98 - 111 mmol/L   CO2 27 22 - 32 mmol/L   Glucose, Bld 105 (H) 70 - 99 mg/dL   BUN 8 6 - 20 mg/dL   Creatinine, Ser 8.46 0.44 - 1.00 mg/dL   Calcium 8.6 (L) 8.9 - 10.3 mg/dL   GFR calc non Af Amer >60 >60 mL/min   GFR calc Af Amer >60 >60 mL/min   Anion gap 11 5 - 15  VITAMIN D 25 Hydroxy (Vit-D Deficiency, Fractures)   Collection Time: 06/07/19  5:39 AM  Result Value Ref Range   Vit D, 25-Hydroxy 20.25 (L) 30 - 100 ng/mL  CBC   Collection Time: 06/08/19  2:59 AM  Result Value Ref Range   WBC 6.3 4.0 - 10.5 K/uL   RBC 3.20 (L) 3.87 - 5.11 MIL/uL   Hemoglobin 9.6 (L) 12.0 - 15.0 g/dL   HCT 65.9 (L) 93.5 - 70.1 %   MCV 90.6 80.0 -  100.0 fL   MCH 30.0 26.0 - 34.0 pg   MCHC 33.1 30.0 -  36.0 g/dL   RDW 13.0 11.5 - 15.5 %   Platelets 187 150 - 400 K/uL   nRBC 0.0 0.0 - 0.2 %  Basic metabolic panel   Collection Time: 06/08/19  2:59 AM  Result Value Ref Range   Sodium 139 135 - 145 mmol/L   Potassium 3.8 3.5 - 5.1 mmol/L   Chloride 102 98 - 111 mmol/L   CO2 28 22 - 32 mmol/L   Glucose, Bld 117 (H) 70 - 99 mg/dL   BUN 9 6 - 20 mg/dL   Creatinine, Ser 0.67 0.44 - 1.00 mg/dL   Calcium 8.4 (L) 8.9 - 10.3 mg/dL   GFR calc non Af Amer >60 >60 mL/min   GFR calc Af Amer >60 >60 mL/min   Anion gap 9 5 - 15  Creatinine, serum   Collection Time: 06/11/19  3:39 AM  Result Value Ref Range   Creatinine, Ser 0.72 0.44 - 1.00 mg/dL   GFR calc non Af Amer >60 >60 mL/min   GFR calc Af Amer >60 >60 mL/min  CBC   Collection Time: 06/11/19  3:39 AM  Result Value Ref Range   WBC 6.9 4.0 - 10.5 K/uL   RBC 3.68 (L) 3.87 - 5.11 MIL/uL   Hemoglobin 11.3 (L) 12.0 - 15.0 g/dL   HCT 34.5 (L) 36.0 - 46.0 %   MCV 93.8 80.0 - 100.0 fL   MCH 30.7 26.0 - 34.0 pg   MCHC 32.8 30.0 - 36.0 g/dL   RDW 13.2 11.5 - 15.5 %   Platelets 346 150 - 400 K/uL   nRBC 0.0 0.0 - 0.2 %     Treatments: 1.  Open reduction internal fixation left distal radius fracture 2. Placement/removal of external fixatator left ankle 3. Open reduction internal fixation of left pilon fracture 4. Open reduction internal fixation of left tibial shaft fracture  Discharge Exam: General - Sitting up in bedside chair, no acute distress Respiratory -  No increased work of breathing.  Left lower extremity - Splint in place, is clean, dry, intact. Non-tender above splint. Sensation intact to light touch of toes.  Wiggles toes.  Toes are warm and well perfused.   Left upper extremity - Removable wrist splint in place.  Sensation and motor function intact to the radial, median, ulnar nerve distributions.  Full elbow motion.  Hand is warm and well-perfused.  Disposition: Discharge disposition: 06-Home-Health Care  Svc       Allergies as of 06/11/2019      Reactions   Penicillins Hives, Swelling      Medication List    TAKE these medications   enoxaparin 40 MG/0.4ML injection Commonly known as: LOVENOX Inject 0.4 mLs (40 mg total) into the skin daily for 21 days. Start taking on: June 12, 2019   HYDROcodone-acetaminophen 5-325 MG tablet Commonly known as: NORCO/VICODIN Take 1 tablet by mouth every 8 (eight) hours as needed for severe pain.   sertraline 100 MG tablet Commonly known as: ZOLOFT Take 100 mg by mouth daily.            Durable Medical Equipment  (From admission, onward)         Start     Ordered   06/11/19 1544  For home use only DME 3 n 1  Once     06/11/19 1544   06/11/19 1543  For home use only DME Gilford Rile  rolling  Once    Comments: Needs Left Arm platform  Question Answer Comment  Walker: With 5 Inch Wheels   Patient needs a walker to treat with the following condition Tibia fracture      06/11/19 1544         Follow-up Information    Bjorn Pippin, MD In 5 weeks.   Specialty: Orthopedic Surgery Why: For incision check and repeat x-rays of left wrist Contact information: [address] [place] [address] [phone]        [facility] Follow up.   Why: [employer] is providing a Agricultural consultant with 5 in wheels with attached left arm platform.  You also will have a needed 3:1 commode. Contact information: [address] [place] [address] [phone]        Haddix, Gillie Manners, MD. Schedule an appointment as soon as possible for a visit in 2 week(s).   Specialty: Orthopedic Surgery Why: for repeat x-rays, splint removal Contact information: [address] [place] [address] [phone]           Discharge Instructions and Plan: Patient will be discharged to home.  She will remain nonweightbearing on the left lower extremity.  She will have a 1 pound weight limit through the left hand/wrist.  She may  be weightbearing as tolerated to the left elbow.  Will be discharged on Lovenox x21 days for DVT prophylaxis. Patient has been provided with all the necessary DME for discharge. Patient will follow up with Dr. Jena Gauss in 2 weeks for repeat x-rays and splint removal.  She will follow-up with Dr. Everardo Pacific for her left wrist in approximately 5 weeks.   Signed:  Shawn Route. Ladonna Snide ?([phone]? (phone) 06/11/2019, 4:17 PM  [facility] [address] [place] [address] [phone] (F)

## 2019-06-11 NOTE — Discharge Instructions (Signed)
Ophelia Charter MD, MPH Noemi Chapel, PA-C [place] [address] [phone] (tel)   [phone] (fax)   POST-OPERATIVE INSTRUCTIONS - WRIST   WOUND CARE ? You may leave your incision open to air ? Reinforce with dry gauze as needed ? Keep incisions clean and dry  EXERCISES ? Use your removable wrist splint at all times. You may remove for showering ? You may begin working on ROM of your left wrist as you feel comfortable. ? DO NOT LIFT ANYTHING HEAVIER THAN ONE POUND WITH YOUR OPERATIVE ARM. ? Please continue to work on range of motion of your fingers and stretch these multiple times a day to prevent stiffness.  FOLLOW-UP ? If you develop a Fever (>101.5), Redness or Drainage from the surgical incision site, please call our office to arrange for an evaluation. ? Please call the office to schedule a follow-up appointment for your incision check if you do not already have one, 7-10 days post-operatively.  HELPFUL INFORMATION  . You may be more comfortable sleeping in a semi-seated position the first few nights following surgery.  Keep a pillow propped under the elbow and forearm for comfort.  If you have a recliner type of chair it might be beneficial.  If not that is fine too, but it would be helpful to sleep propped up with pillows behind your operated shoulder as well under your elbow and forearm.  This will reduce pulling on the suture lines.  . When dressing, put your operative arm in the sleeve first.  When getting undressed, take your operative arm out last.  Loose fitting, button-down shirts are recommended.  Often in the first days after surgery you may be more comfortable keeping your operative arm under your shirt and not through the sleeve.  . You may return to work/school in the next couple of days when you feel up to it.  Desk work and typing in the sling is fine.  . We suggest you use the pain medication the first night prior to  going to bed, in order to ease any pain when the anesthesia wears off. You should avoid taking pain medications on an empty stomach as it will make you nauseous.  . You should wean off your narcotic medicines as soon as you are able.  Most patients will be off or using minimal narcotics before their first postop appointment.   . Do not drink alcoholic beverages or take illicit drugs when taking pain medications.  . It is against the law to drive while taking narcotics.  In some states it is against the law to drive while your arm is in a sling.   . Pain medication may make you constipated.  Below are a few solutions to try in this order:   - Decrease the amount of pain medication if you aren't having pain.   - Drink lots of decaffeinated fluids.   - Drink prune juice and/or each dried prunes  . If the first 3 don't work start with additional solutions   - Take Colace - an over-the-counter stool softener   - Take Senokot - an over-the-counter laxative   - Take Miralax - a stronger over-the-counter laxative    Orthopaedic Trauma Service Discharge Instructions   General Discharge Instructions  WEIGHT BEARING STATUS: Non-weightbearing on left leg  RANGE OF MOTION/ACTIVITY: Okay for knee motion.   Wound Care: Keep splint on until follow-up. Okay to shower as long as splint does not get wet.  DVT/PE prophylaxis: Lovenox x 21 days  Diet: as you were eating previously.  Can use over the counter stool softeners and bowel preparations, such as Miralax, to help with bowel movements.  Narcotics can be constipating.  Be sure to drink plenty of fluids  PAIN MEDICATION USE AND EXPECTATIONS  You have likely been given narcotic medications to help control your pain.  After a traumatic event that results in an fracture (broken bone) with or without surgery, it is ok to use narcotic pain medications to help control one's pain.  We understand that everyone responds to pain differently and each  individual patient will be evaluated on a regular basis for the continued need for narcotic medications. Ideally, narcotic medication use should last no more than 6-8 weeks (coinciding with fracture healing).   As a patient it is your responsibility as well to monitor narcotic medication use and report the amount and frequency you use these medications when you come to your office visit.   We would also advise that if you are using narcotic medications, you should take a dose prior to therapy to maximize you participation.  IF YOU ARE ON NARCOTIC MEDICATIONS IT IS NOT PERMISSIBLE TO OPERATE A MOTOR VEHICLE (MOTORCYCLE/CAR/TRUCK/MOPED) OR HEAVY MACHINERY DO NOT MIX NARCOTICS WITH OTHER CNS (CENTRAL NERVOUS SYSTEM) DEPRESSANTS SUCH AS ALCOHOL   STOP SMOKING OR USING NICOTINE PRODUCTS!!!!  As discussed nicotine severely impairs your body's ability to heal surgical and traumatic wounds but also impairs bone healing.  Wounds and bone heal by forming microscopic blood vessels (angiogenesis) and nicotine is a vasoconstrictor (essentially, shrinks blood vessels).  Therefore, if vasoconstriction occurs to these microscopic blood vessels they essentially disappear and are unable to deliver necessary nutrients to the healing tissue.  This is one modifiable factor that you can do to dramatically increase your chances of healing your injury.    (This means no smoking, no nicotine gum, patches, etc)  DO NOT USE NONSTEROIDAL ANTI-INFLAMMATORY DRUGS (NSAID'S)  Using products such as Advil (ibuprofen), Aleve (naproxen), Motrin (ibuprofen) for additional pain control during fracture healing can delay and/or prevent the healing response.  If you would like to take over the counter (OTC) medication, Tylenol (acetaminophen) is ok.  However, some narcotic medications that are given for pain control contain acetaminophen as well. Therefore, you should not exceed more than 4000 mg of tylenol in a day if you do not have liver  disease.  Also note that there are may OTC medicines, such as cold medicines and allergy medicines that my contain tylenol as well.  If you have any questions about medications and/or interactions please ask your doctor/PA or your pharmacist.      ICE AND ELEVATE INJURED/OPERATIVE EXTREMITY  Using ice and elevating the injured extremity above your heart can help with swelling and pain control.  Icing in a pulsatile fashion, such as 20 minutes on and 20 minutes off, can be followed.    Do not place ice directly on skin. Make sure there is a barrier between to skin and the ice pack.    Using frozen items such as frozen peas works well as the conform nicely to the are that needs to be iced.  USE AN ACE WRAP OR TED HOSE FOR SWELLING CONTROL  In addition to icing and elevation, Ace wraps or TED hose are used to help limit and resolve swelling.  It is recommended to use Ace wraps or TED hose until you are informed to stop.  When using Ace Wraps start the wrapping distally (farthest away from the body) and wrap proximally (closer to the body)   Example: If you had surgery on your leg or thing and you do not have a splint on, start the ace wrap at the toes and work your way up to the thigh        If you had surgery on your upper extremity and do not have a splint on, start the ace wrap at your fingers and work your way up to the upper arm  IF YOU ARE IN A SPLINT OR CAST DO NOT REMOVE IT FOR ANY REASON   If your splint gets wet for any reason please contact the office immediately. You may shower in your splint or cast as long as you keep it dry.  This can be done by wrapping in a cast cover or garbage back (or similar)  Do Not stick any thing down your splint or cast such as pencils, money, or hangers to try and scratch yourself with.  If you feel itchy take benadryl as prescribed on the bottle for itching  IF YOU ARE IN A CAM BOOT (BLACK BOOT)  You may remove boot periodically. Perform daily dressing  changes as noted below.  Wash the liner of the boot regularly and wear a sock when wearing the boot. It is recommended that you sleep in the boot until told otherwise   CALL THE OFFICE WITH ANY QUESTIONS OR CONCERNS: [phone]   VISIT OUR WEBSITE FOR ADDITIONAL INFORMATION: [email or web address]     Discharge Wound Care Instructions  Do NOT apply any ointments, solutions or lotions to pin sites or surgical wounds.  These prevent needed drainage and even though solutions like hydrogen peroxide kill bacteria, they also damage cells lining the pin sites that help fight infection.  Applying lotions or ointments can keep the wounds moist and can cause them to breakdown and open up as well. This can increase the risk for infection. When in doubt call the office.  Surgical incisions should be dressed daily.  If any drainage is noted, use one layer of adaptic, then gauze, Kerlix, and an ace wrap.  Once the incision is completely dry and without drainage, it may be left open to air out.  Showering may begin 36-48 hours later.  Cleaning gently with soap and water.  Traumatic wounds should be dressed daily as well.    One layer of adaptic, gauze, Kerlix, then ace wrap.  The adaptic can be discontinued once the draining has ceased    If you have a wet to dry dressing: wet the gauze with saline the squeeze as much saline out so the gauze is moist (not soaking wet), place moistened gauze over wound, then place a dry gauze over the moist one, followed by Kerlix wrap, then ace wrap.

## 2019-06-24 ENCOUNTER — Ambulatory Visit: Payer: BC Managed Care – PPO | Attending: Student

## 2019-06-24 ENCOUNTER — Other Ambulatory Visit: Payer: Self-pay

## 2019-06-24 DIAGNOSIS — M6281 Muscle weakness (generalized): Secondary | ICD-10-CM | POA: Diagnosis present

## 2019-06-24 DIAGNOSIS — M25532 Pain in left wrist: Secondary | ICD-10-CM | POA: Diagnosis not present

## 2019-06-24 DIAGNOSIS — M79662 Pain in left lower leg: Secondary | ICD-10-CM | POA: Insufficient documentation

## 2019-06-24 DIAGNOSIS — R2689 Other abnormalities of gait and mobility: Secondary | ICD-10-CM | POA: Diagnosis present

## 2019-06-24 NOTE — Therapy (Signed)
[facility] [address] [place], [place], [address] Phone: [phone]   Fax:  [phone]  Physical Therapy Evaluation  Patient Details  Name: Regina Goodman MRN: [id number] Date of Birth: 1966/01/27 Referring Provider (PT): Truitt Merle, MD   Encounter Date: 06/24/2019  PT End of Session - 06/24/19 1511    Visit Number  1    Date for PT Re-Evaluation  08/19/19    Authorization Type  [employer]    PT Start Time  1434    PT Stop Time  1514    PT Time Calculation (min)  40 min    Activity Tolerance  Patient tolerated treatment well    Behavior During Therapy  [facility] for tasks assessed/performed       History reviewed. No pertinent past medical history.  Past Surgical History:  Procedure Laterality Date  . EXTERNAL FIXATION LEG Left 06/04/2019   Procedure: EXTERNAL FIXATION LEG;  Surgeon: Bjorn Pippin, MD;  Location: [facility] OR;  Service: Orthopedics;  Laterality: Left;  . OPEN REDUCTION INTERNAL FIXATION (ORIF) TIBIA/FIBULA FRACTURE Left 06/06/2019   Procedure: OPEN REDUCTION INTERNAL FIXATION (ORIF) TIBIA/FIBULA FRACTURE;  Surgeon: Roby Lofts, MD;  Location: [facility] OR;  Service: Orthopedics;  Laterality: Left;  . ORIF WRIST FRACTURE Left 06/04/2019  . ORIF WRIST FRACTURE Left 06/04/2019   Procedure: OPEN REDUCTION INTERNAL FIXATION (ORIF) WRIST FRACTURE;  Surgeon: Bjorn Pippin, MD;  Location: [facility] OR;  Service: Orthopedics;  Laterality: Left;    There were no vitals filed for this visit.   Subjective Assessment - 06/24/19 1423    Subjective  Pt presents to PT s/p ORIF Lt distal radius and Lt tibia due to fractures sustained during a fall on 06/04/19.    Pertinent History  ORIF Lt radius (06/04/19), ORIF Lt tibia (05/1119)    How long can you walk comfortably?  with platform walker, NWB on the Lt    Patient Stated Goals  walk without device    Currently in Pain?  No/denies   both joints fixated in brace/cast         [facility] PT Assessment - 06/24/19 0001      Assessment   Medical Diagnosis  Lt distal radius fracture s/p ORIF, Lt distal tibia fracture s/p ORIF     Referring Provider (PT)  Truitt Merle, MD    Onset Date/Surgical Date  06/04/19    Hand Dominance  Right      Precautions   Precautions  Other (comment)    Precaution Comments  NWB Lt LE      Restrictions   Weight Bearing Restrictions  Yes    LUE Weight Bearing  Weight bearing as tolerated    Other Position/Activity Restrictions  though elbow.  1# weight restriction hand/wrist      Balance Screen   Has the patient fallen in the past 6 months  Yes    How many times?  1   no balance deficits   Has the patient had a decrease in activity level because of a fear of falling?   No    Is the patient reluctant to leave their home because of a fear of falling?   No      Home Environment   Living Environment  Private residence    Living Arrangements  Spouse/significant other    Type of Home  House    Home Access  Stairs to enter    Entrance Stairs-Number of Steps  3    Home Layout  One level    Home Equipment  Walker - 2 wheels;Toilet riser;Wheelchair - Designer, industrial/product with Lt platform     Prior Function   Level of Independence  Independent    Vocation  Retired    Leisure  exercise at [employer], walking for exercise      Cognition   Overall Cognitive Status  Within Functional Limits for tasks assessed      Observation/Other Assessments   Focus on Therapeutic Outcomes (FOTO)   61% limitation   Lt wrist     Posture/Postural Control   Posture/Postural Control  Postural limitations    Postural Limitations  Forward head      ROM / Strength   AROM / PROM / Strength  AROM;PROM;Strength      AROM   Overall AROM   Deficits    Overall AROM Comments  Rt wrist and LE full.  Lt hip and knee WFLs.  Lt ankle not assessed due to cast.    AROM Assessment Site  Wrist;Hip;Knee    Right/Left Wrist  Left    Left Wrist  Extension  20 Degrees    Left Wrist Flexion  30 Degrees    Left Wrist Radial Deviation  20 Degrees    Left Wrist Ulnar Deviation  15 Degrees      PROM   Overall PROM   Deficits    Overall PROM Comments  Lt wrist supination and pronation limited by 25%    PROM Assessment Site  Wrist    Right/Left Wrist  Left    Left Wrist Extension  30 Degrees    Left Wrist Flexion  45 Degrees    Left Wrist Radial Deviation  25 Degrees    Left Wrist Ulnar Deviation  20 Degrees      Strength   Overall Strength  Due to precautions;Unable to assess    Strength Assessment Site  Hand    Right/Left hand  Left;Right    Right Hand Grip (lbs)  55    Left Hand Grip (lbs)  8      Palpation   Palpation comment  [facility] over Lt radius ventral surface.  Atrophy of the Lt forearm      Transfers   Transfers  Sit to Stand;Stand to Sit    Sit to Stand  With upper extremity assist    Stand to Sit  With upper extremity assist      Ambulation/Gait   Ambulation/Gait  Yes    Ambulation/Gait Assistance  6: Modified independent (Device/Increase time)    Gait Comments  platform walker with NWB on the Lt LE                Objective measurements completed on examination: See above findings.              PT Education - 06/24/19 1506    Education Details  Access Code: [email or web address]    Person(s) Educated  Patient    Methods  Explanation;Demonstration;Handout    Comprehension  Verbalized understanding;Returned demonstration       PT Short Term Goals - 06/24/19 1518      PT SHORT TERM GOAL #1   Title  be independent in initial HEP    Time  4    Period  Weeks    Status  New    Target Date  07/22/19      PT SHORT TERM GOAL #2  Title  demonstrate Lt grip strength to > or = to 25# to improve ability to hold objects in the Lt hand    Time  4    Period  Weeks    Status  New    Target Date  07/22/19      PT SHORT TERM GOAL #3   Title  demonstrate gait with PWB as  prescribed by MD with use of walker    Time  4    Period  Weeks    Status  New    Target Date  07/22/19        PT Long Term Goals - 06/24/19 1520      PT LONG TERM GOAL #1   Title  be independent in advanced HEP    Time  8    Period  Weeks    Status  New    Target Date  08/19/19      PT LONG TERM GOAL #2   Title  reduce FOTO for wrist to < or = to 36% limitation    Time  8    Period  Weeks    Status  New    Target Date  08/19/19      PT LONG TERM GOAL #3   Title  demonstrate Lt ankle strength to 4-/5 throughout to improve stability when allowed to weightbear with gait    Time  8    Period  Weeks    Status  New    Target Date  08/19/19      PT LONG TERM GOAL #4   Title  demonstrate Lt grip strength to > or  = to 45# to improve ability to hold objects in the Lt UE    Time  8    Period  Weeks    Status  New    Target Date  08/19/19      PT LONG TERM GOAL #5   Title  demonstrate > or = to 4/5 to 4+/5 Lt wrist strength throughout to improve endurance with use for ADLs and self-care    Time  8    Period  Weeks    Status  New    Target Date  08/19/19      Additional Long Term Goals   Additional Long Term Goals  Yes      PT LONG TERM GOAL #6   Title  ambulate with WBAT with cane or walker as prescribed by MD for all distances    Time  8    Period  Weeks    Status  New    Target Date  08/19/19             Plan - 06/24/19 1526    Clinical Impression Statement  Pt presents to PT s/p Lt distal radius fracture with ORIF and Lt distal tibia fracture s/p ORIF.  Pt sustained a fall on 06/04/19 when getting caught up in a dog leash and had surgery 06/04/19 (wrist) and 06/06/19 (tibia).  Pt is now NWB on the Lt LE and limited to 1lb in Lt hand/wrist and WBAT through the Lt elbow.  PT was not able to assess Lt ankle due to cast is still present.  Pt with 4/5 Lt hip strength and knee strength not tested today.  Pt with reduced Lt grip strength and wrist A/ROM and strength.  Pt  ambulates with NWB on the Lt with asymmetric pattern due to precautions and requires Rt UE support with transitions.  Pt will benefit from skilled PT to improve Lt wrist and hand strength and Lt LE function s/p ORIF of Lt tibia.    Personal Factors and Comorbidities  Comorbidity 1    Comorbidities  fall and ORIF    Examination-Activity Limitations  Dressing;Locomotion Level;Stand    Examination-Participation Restrictions  Shop;Meal Prep    Stability/Clinical Decision Making  Evolving/Moderate complexity    Clinical Decision Making  Moderate    Rehab Potential  Excellent    PT Frequency  2x / week    PT Duration  8 weeks    PT Treatment/Interventions  ADLs/Self Care Home Management;Cryotherapy;Electrical Stimulation;Aquatic Therapy;Moist Heat;Traction;Ultrasound;Neuromuscular re-education;Therapeutic exercise;Therapeutic activities;Gait training;Stair training;Functional mobility training;Patient/family education;Manual techniques;Passive range of motion;Scar mobilization;Taping;Vasopneumatic Device    PT Next Visit Plan  gentle wrist mobility, grip strength, Lt hip and knee A/ROM and strength, assess Lt ankle once cast is removed.    PT Home Exercise Plan  Access Code: [email or web address]    Consulted and Agree with Plan of Care  Patient       Patient will benefit from skilled therapeutic intervention in order to improve the following deficits and impairments:  Decreased activity tolerance, Decreased balance, Decreased mobility, Decreased strength, Impaired flexibility, Decreased scar mobility, Pain, Impaired UE functional use, Decreased endurance, Abnormal gait, Decreased range of motion, Difficulty walking  Visit Diagnosis: Pain in left wrist - Plan: PT plan of care cert/re-cert  Other abnormalities of gait and mobility - Plan: PT plan of care cert/re-cert  Muscle weakness (generalized) - Plan: PT plan of care cert/re-cert  Pain in left lower leg - Plan: PT plan of care cert/re-cert     Problem  List Patient Active Problem List   Diagnosis Date Noted  . Fracture of tibial shaft, closed 06/07/2019  . Closed left pilon fracture, initial encounter 06/07/2019  . Fall 06/07/2019  . Closed fracture of left distal radius 06/04/2019    Sigurd Sos, PT 06/24/19 3:32 PM  [facility] [address], [place], [place], [address] Phone: [phone]   Fax:  [phone]  Name: Regina Goodman MRN: [id number] Date of Birth: [birth year]

## 2019-06-24 NOTE — Patient Instructions (Signed)
Access Code: [email or web address] URL: [email or web address] Date: 06/24/2019 Prepared by: Tresa Endo  Exercises Standing Wrist Flexion Stretch - 3 x daily - 7 x weekly - 3 reps - 1 sets - 20 hold Standing Wrist Extension Stretch - 3 x daily - 7 x weekly - 3 reps - 1 sets - 20 hold Wrist Extension AROM - 3 x daily - 7 x weekly - 2 sets - 10 reps Wrist Flexion AROM - 3 x daily - 7 x weekly - 2 sets - 10 reps Wrist Radial Deviation AROM - 3 x daily - 7 x weekly - 2 sets - 10 reps Seated Forearm Pronation and Supination AROM - 3 x daily - 7 x weekly - 2 sets - 10 reps

## 2019-06-26 ENCOUNTER — Ambulatory Visit: Payer: BC Managed Care – PPO | Admitting: Physical Therapy

## 2019-06-26 ENCOUNTER — Other Ambulatory Visit: Payer: Self-pay

## 2019-06-26 ENCOUNTER — Encounter: Payer: Self-pay | Admitting: Physical Therapy

## 2019-06-26 ENCOUNTER — Telehealth: Payer: Self-pay | Admitting: Physical Therapy

## 2019-06-26 DIAGNOSIS — M25532 Pain in left wrist: Secondary | ICD-10-CM | POA: Diagnosis not present

## 2019-06-26 DIAGNOSIS — M79662 Pain in left lower leg: Secondary | ICD-10-CM

## 2019-06-26 DIAGNOSIS — M6281 Muscle weakness (generalized): Secondary | ICD-10-CM

## 2019-06-26 DIAGNOSIS — R2689 Other abnormalities of gait and mobility: Secondary | ICD-10-CM

## 2019-06-26 NOTE — Therapy (Addendum)
[facility] [address] [place], [place], [address] Phone: [phone]   Fax:  [phone]  Physical Therapy Treatment  Patient Details  Name: Regina Goodman MRN: [id number] Date of Birth: 01/17/1966 Referring Provider (PT): Katha Hamming, MD   Encounter Date: 06/26/2019  PT End of Session - 06/26/19 5170    Visit Number  2    Date for PT Re-Evaluation  08/19/19    Authorization Type  [employer]    PT Start Time  1400    PT Stop Time  1455    PT Time Calculation (min)  55 min    Activity Tolerance  Patient tolerated treatment well    Behavior During Therapy  [facility] for tasks assessed/performed       History reviewed. No pertinent past medical history.  Past Surgical History:  Procedure Laterality Date  . EXTERNAL FIXATION LEG Left 06/04/2019   Procedure: EXTERNAL FIXATION LEG;  Surgeon: Hiram Gash, MD;  Location: [place];  Service: Orthopedics;  Laterality: Left;  . OPEN REDUCTION INTERNAL FIXATION (ORIF) TIBIA/FIBULA FRACTURE Left 06/06/2019   Procedure: OPEN REDUCTION INTERNAL FIXATION (ORIF) TIBIA/FIBULA FRACTURE;  Surgeon: Shona Needles, MD;  Location: [place];  Service: Orthopedics;  Laterality: Left;  . ORIF WRIST FRACTURE Left 06/04/2019  . ORIF WRIST FRACTURE Left 06/04/2019   Procedure: OPEN REDUCTION INTERNAL FIXATION (ORIF) WRIST FRACTURE;  Surgeon: Hiram Gash, MD;  Location: [place];  Service: Orthopedics;  Laterality: Left;    There were no vitals filed for this visit.  Subjective Assessment - 06/26/19 1401    Subjective  Pt arrived with order for ankle ROM. Will see about scheduling pt for ankle eval. No pain.    Pertinent History  ORIF Lt radius (06/04/19), ORIF Lt tibia (05/1119)    Currently in Pain?  No/denies    Multiple Pain Sites  No         [facility] PT Assessment - 06/26/19 0001      Observation/Other Assessments-Edema    Edema  Circumferential      Circumferential Edema   Circumferential - Left   28 cm      AROM   Overall AROM Comments  Lt ankle AROM: DF 6 degrees, PF 51 degrees, Eversion 15 degrees, Inversion 23 degrees                    [facility] Adult PT Treatment/Exercise - 06/26/19 0001      Knee/Hip Exercises: Seated   Long Arc Quad  AROM;Strengthening;Left;2 sets;10 reps    Heel Slides  --   30x with towel on the floor     Wrist Exercises   Wrist Flexion  AROM;Left;10 reps    Wrist Extension  AROM;Left;20 reps;Seated    Other wrist exercises  Active forearm supination 10x, VC to relax shoulder       Vasopneumatic   Number Minutes Vasopneumatic   10 minutes    Vasopnuematic Location   Ankle    Vasopneumatic Pressure  Medium    Vasopneumatic Temperature   3 flakes             PT Education - 06/26/19 1429    Education Details  Ankle AROM exercises for HEP. LAQ for Lt quad    Person(s) Educated  Patient    Methods  Explanation;Demonstration;Handout    Comprehension  Returned demonstration;Verbalized understanding       PT Short Term Goals - 06/24/19 [phone]  PT SHORT TERM GOAL #1   Title  be independent in initial HEP    Time  4    Period  Weeks    Status  New    Target Date  07/22/19      PT SHORT TERM GOAL #2   Title  demonstrate Lt grip strength to > or = to 25# to improve ability to hold objects in the Lt hand    Time  4    Period  Weeks    Status  New    Target Date  07/22/19      PT SHORT TERM GOAL #3   Title  demonstrate gait with PWB as prescribed by MD with use of walker    Time  4    Period  Weeks    Status  New    Target Date  07/22/19        PT Long Term Goals - 06/24/19 1520      PT LONG TERM GOAL #1   Title  be independent in advanced HEP    Time  8    Period  Weeks    Status  New    Target Date  08/19/19      PT LONG TERM GOAL #2   Title  reduce FOTO for wrist to < or = to 36% limitation    Time  8    Period  Weeks    Status  New    Target Date  08/19/19      PT LONG TERM  GOAL #3   Title  demonstrate Lt ankle strength to 4-/5 throughout to improve stability when allowed to weightbear with gait    Time  8    Period  Weeks    Status  New    Target Date  08/19/19      PT LONG TERM GOAL #4   Title  demonstrate Lt grip strength to > or  = to 45# to improve ability to hold objects in the Lt UE    Time  8    Period  Weeks    Status  New    Target Date  08/19/19      PT LONG TERM GOAL #5   Title  demonstrate > or = to 4/5 to 4+/5 Lt wrist strength throughout to improve endurance with use for ADLs and self-care    Time  8    Period  Weeks    Status  New    Target Date  08/19/19      Additional Long Term Goals   Additional Long Term Goals  Yes      PT LONG TERM GOAL #6   Title  ambulate with WBAT with cane or walker as prescribed by MD for all distances    Time  8    Period  Weeks    Status  New    Target Date  08/19/19            Plan - 06/26/19 1400    Clinical Impression Statement  Pt arrives with orders to being Lt ankle ROM exercises. Can weightbear in 4 weeks. No pain. Ankle has moderate edema. Added ankle ROM exercises to HEP and used the [employer] for edema magagement. See AROM measurements in chart. Pt is independent in initial HEP for wristt exercises, requiring min verbal cuing for improved positioning of LT UE.    Personal Factors and Comorbidities  Comorbidity 1    Comorbidities  fall  and ORIF    Examination-Activity Limitations  Dressing;Locomotion Level;Stand    Examination-Participation Restrictions  Shop;Meal Prep    Stability/Clinical Decision Making  Evolving/Moderate complexity    Rehab Potential  Excellent    PT Frequency  2x / week    PT Duration  8 weeks    PT Treatment/Interventions  ADLs/Self Care Home Management;Cryotherapy;Electrical Stimulation;Aquatic Therapy;Moist Heat;Traction;Ultrasound;Neuromuscular re-education;Therapeutic exercise;Therapeutic activities;Gait training;Stair training;Functional mobility  training;Patient/family education;Manual techniques;Passive range of motion;Scar mobilization;Taping;Vasopneumatic Device    PT Next Visit Plan  Review ankle ROM exercises, try Nustep, quad strength, measure grip    PT Home Exercise Plan  Access Code: [email or web address]    Consulted and Agree with Plan of Care  Patient       Patient will benefit from skilled therapeutic intervention in order to improve the following deficits and impairments:  Decreased activity tolerance, Decreased balance, Decreased mobility, Decreased strength, Impaired flexibility, Decreased scar mobility, Pain, Impaired UE functional use, Decreased endurance, Abnormal gait, Decreased range of motion, Difficulty walking  Visit Diagnosis: Pain in left wrist  Other abnormalities of gait and mobility  Muscle weakness (generalized)  Pain in left lower leg     Problem List Patient Active Problem List   Diagnosis Date Noted  . Fracture of tibial shaft, closed 06/07/2019  . Closed left pilon fracture, initial encounter 06/07/2019  . Fall 06/07/2019  . Closed fracture of left distal radius 06/04/2019  Frederica Kuster, PTA 06/26/19 3:02 PM Lorrene Reid, PT 06/26/19 3:18 PM  [facility] [address] [place], [place], [address] Phone: [phone]   Fax:  [phone]  Name: Naamah Boggess MRN: [id number] Date of Birth: 1965/07/02

## 2019-07-03 ENCOUNTER — Other Ambulatory Visit: Payer: Self-pay

## 2019-07-03 ENCOUNTER — Ambulatory Visit: Payer: BC Managed Care – PPO | Attending: Student | Admitting: Physical Therapy

## 2019-07-03 ENCOUNTER — Encounter: Payer: Self-pay | Admitting: Physical Therapy

## 2019-07-03 DIAGNOSIS — M25532 Pain in left wrist: Secondary | ICD-10-CM | POA: Insufficient documentation

## 2019-07-03 DIAGNOSIS — M6281 Muscle weakness (generalized): Secondary | ICD-10-CM | POA: Insufficient documentation

## 2019-07-03 DIAGNOSIS — R2689 Other abnormalities of gait and mobility: Secondary | ICD-10-CM | POA: Diagnosis present

## 2019-07-03 DIAGNOSIS — M79662 Pain in left lower leg: Secondary | ICD-10-CM | POA: Insufficient documentation

## 2019-07-03 NOTE — Therapy (Signed)
[facility] [address], [address] [place], [place], [address] Phone: [phone]   Fax:  [phone]  Physical Therapy Treatment  Patient Details  Name: Regina Goodman MRN: [id number] Date of Birth: [birth year] Referring Provider (PT): Truitt Merle, MD   Encounter Date: 07/03/2019  PT End of Session - 07/03/19 1609    Visit Number  3    Date for PT Re-Evaluation  08/19/19    Authorization Type  [employer]    PT Start Time  1608    PT Stop Time  1655    PT Time Calculation (min)  47 min    Activity Tolerance  Patient tolerated treatment well    Behavior During Therapy  [facility] for tasks assessed/performed       History reviewed. No pertinent past medical history.  Past Surgical History:  Procedure Laterality Date  . EXTERNAL FIXATION LEG Left 06/04/2019   Procedure: EXTERNAL FIXATION LEG;  Surgeon: Bjorn Pippin, MD;  [facility];  Service: Orthopedics;  Laterality: Left;  . OPEN REDUCTION INTERNAL FIXATION (ORIF) TIBIA/FIBULA FRACTURE Left 06/06/2019   Procedure: OPEN REDUCTION INTERNAL FIXATION (ORIF) TIBIA/FIBULA FRACTURE;  Surgeon: Roby Lofts, MD;  [facility];  Service: Orthopedics;  Laterality: Left;  . ORIF WRIST FRACTURE Left 06/04/2019  . ORIF WRIST FRACTURE Left 06/04/2019   Procedure: OPEN REDUCTION INTERNAL FIXATION (ORIF) WRIST FRACTURE;  Surgeon: Bjorn Pippin, MD;  [facility];  Service: Orthopedics;  Laterality: Left;    There were no vitals filed for this visit.  Subjective Assessment - 07/03/19 1610    Subjective  Doing my HEP    Pertinent History  ORIF Lt radius (06/04/19), ORIF Lt tibia (05/1119)  MD orders: NWB Lt LE, allow for active and passive ROM ankle, strengthening Lt LE, likely advance WB in 4 weeks    Currently in Pain?  No/denies         [facility] PT Assessment - 07/03/19 0001      AROM   Overall AROM Comments  Ankle DF 11 degrees post manual                     [facility] Adult PT Treatment/Exercise - 07/03/19 0001      Knee/Hip Exercises: Seated   Long Arc Quad  Strengthening;Left;3 sets;10 reps;Weights    Long Arc Quad Weight  2 lbs.      Vasopneumatic   Number Minutes Vasopneumatic   15 minutes    Vasopnuematic Location   Ankle    Vasopneumatic Pressure  Medium    Vasopneumatic Temperature   3 flakes      Manual Therapy   Manual Therapy  Soft tissue mobilization    Soft tissue mobilization  LT ankle joint, gastroc, lateral ankle    Passive ROM  Lt ankle 20x all planes      Ankle Exercises: Stretches   Other Stretch  Towel stretch 3x 20  sec aded to HEP      Ankle Exercises: Seated   BAPS  Sitting;Level 2   Just balancing in center, DF/PF practice            PT Education - 07/03/19 1621    Education Details  Towel stretch for HEP    Person(s) Educated  Patient    Methods  Explanation;Tactile cues;Verbal cues;Handout    Comprehension  Returned demonstration;Verbalized understanding       PT Short Term Goals - 06/24/19 1518  PT SHORT TERM GOAL #1   Title  be independent in initial HEP    Time  4    Period  Weeks    Status  New    Target Date  07/22/19      PT SHORT TERM GOAL #2   Title  demonstrate Lt grip strength to > or = to 25# to improve ability to hold objects in the Lt hand    Time  4    Period  Weeks    Status  New    Target Date  07/22/19      PT SHORT TERM GOAL #3   Title  demonstrate gait with PWB as prescribed by MD with use of walker    Time  4    Period  Weeks    Status  New    Target Date  07/22/19        PT Long Term Goals - 06/24/19 1520      PT LONG TERM GOAL #1   Title  be independent in advanced HEP    Time  8    Period  Weeks    Status  New    Target Date  08/19/19      PT LONG TERM GOAL #2   Title  reduce FOTO for wrist to < or = to 36% limitation    Time  8    Period  Weeks    Status  New    Target Date  08/19/19      PT LONG TERM GOAL #3    Title  demonstrate Lt ankle strength to 4-/5 throughout to improve stability when allowed to weightbear with gait    Time  8    Period  Weeks    Status  New    Target Date  08/19/19      PT LONG TERM GOAL #4   Title  demonstrate Lt grip strength to > or  = to 45# to improve ability to hold objects in the Lt UE    Time  8    Period  Weeks    Status  New    Target Date  08/19/19      PT LONG TERM GOAL #5   Title  demonstrate > or = to 4/5 to 4+/5 Lt wrist strength throughout to improve endurance with use for ADLs and self-care    Time  8    Period  Weeks    Status  New    Target Date  08/19/19      Additional Long Term Goals   Additional Long Term Goals  Yes      PT LONG TERM GOAL #6   Title  ambulate with WBAT with cane or walker as prescribed by MD for all distances    Time  8    Period  Weeks    Status  New    Target Date  08/19/19            Plan - 07/03/19 1609    Clinical Impression Statement  Pt independent in initial HEP for ankle ROM. Continues with moderate ankle edema. Pt did improve her active dorsiflexion after manual soft tissue work and PROM. Pt has difficulty with seated BAPS just balancing the board on level 2. Pt could not initially move the Nustep leg portion due to quad inhibition. After a minute or two she could move the pedals 3 min.    Personal Factors and Comorbidities  Comorbidity 1  Comorbidities  fall and ORIF    Examination-Activity Limitations  Dressing;Locomotion Level;Stand    Examination-Participation Restrictions  Shop;Meal Prep    Stability/Clinical Decision Making  Evolving/Moderate complexity    Rehab Potential  Excellent    PT Frequency  2x / week    PT Duration  8 weeks    PT Treatment/Interventions  ADLs/Self Care Home Management;Cryotherapy;Electrical Stimulation;Aquatic Therapy;Moist Heat;Traction;Ultrasound;Neuromuscular re-education;Therapeutic exercise;Therapeutic activities;Gait training;Stair training;Functional mobility  training;Patient/family education;Manual techniques;Passive range of motion;Scar mobilization;Taping;Vasopneumatic Device    PT Next Visit Plan  nustep, measure grip, continue ankle and wrist ROM exercises. pt returns to MD some time next week.    PT Home Exercise Plan  Access Code: [email or web address]    Consulted and Agree with Plan of Care  Patient       Patient will benefit from skilled therapeutic intervention in order to improve the following deficits and impairments:  Decreased activity tolerance, Decreased balance, Decreased mobility, Decreased strength, Impaired flexibility, Decreased scar mobility, Pain, Impaired UE functional use, Decreased endurance, Abnormal gait, Decreased range of motion, Difficulty walking  Visit Diagnosis: Pain in left wrist  Other abnormalities of gait and mobility  Muscle weakness (generalized)  Pain in left lower leg     Problem List Patient Active Problem List   Diagnosis Date Noted  . Fracture of tibial shaft, closed 06/07/2019  . Closed left pilon fracture, initial encounter 06/07/2019  . Fall 06/07/2019  . Closed fracture of left distal radius 06/04/2019    Iridian Reader, PTA 07/03/2019, 4:52 PM  [facility] [address] [place], [place], [address] Phone: [phone]   Fax:  [phone]  Name: Nahomy Limburg MRN: [id number] Date of Birth: 1965-06-27

## 2019-07-10 ENCOUNTER — Encounter: Payer: Self-pay | Admitting: Physical Therapy

## 2019-07-10 ENCOUNTER — Other Ambulatory Visit: Payer: Self-pay

## 2019-07-10 ENCOUNTER — Ambulatory Visit: Payer: BC Managed Care – PPO | Admitting: Physical Therapy

## 2019-07-10 DIAGNOSIS — M79662 Pain in left lower leg: Secondary | ICD-10-CM

## 2019-07-10 DIAGNOSIS — M25532 Pain in left wrist: Secondary | ICD-10-CM | POA: Diagnosis not present

## 2019-07-10 DIAGNOSIS — M6281 Muscle weakness (generalized): Secondary | ICD-10-CM

## 2019-07-10 DIAGNOSIS — R2689 Other abnormalities of gait and mobility: Secondary | ICD-10-CM

## 2019-07-10 NOTE — Therapy (Signed)
[facility] [address] [place], [place], [address] Phone: [phone]   Fax:  [phone]  Physical Therapy Treatment  Patient Details  Name: Regina Goodman MRN: [id number] Date of Birth: Jul 14, 1965 Referring Provider (PT): Katha Hamming, MD   Encounter Date: 07/10/2019  PT End of Session - 07/10/19 1610    Visit Number  4    Date for PT Re-Evaluation  08/19/19    Authorization Type  [employer]    PT Start Time  1605    PT Stop Time  1705    PT Time Calculation (min)  60 min    Activity Tolerance  Patient tolerated treatment well    Behavior During Therapy  [facility] for tasks assessed/performed       History reviewed. No pertinent past medical history.  Past Surgical History:  Procedure Laterality Date  . EXTERNAL FIXATION LEG Left 06/04/2019   Procedure: EXTERNAL FIXATION LEG;  Surgeon: Hiram Gash, MD;  Location: [place];  Service: Orthopedics;  Laterality: Left;  . OPEN REDUCTION INTERNAL FIXATION (ORIF) TIBIA/FIBULA FRACTURE Left 06/06/2019   Procedure: OPEN REDUCTION INTERNAL FIXATION (ORIF) TIBIA/FIBULA FRACTURE;  Surgeon: Shona Needles, MD;  Location: [place];  Service: Orthopedics;  Laterality: Left;  . ORIF WRIST FRACTURE Left 06/04/2019  . ORIF WRIST FRACTURE Left 06/04/2019   Procedure: OPEN REDUCTION INTERNAL FIXATION (ORIF) WRIST FRACTURE;  Surgeon: Hiram Gash, MD;  Location: [place];  Service: Orthopedics;  Laterality: Left;    There were no vitals filed for this visit.  Subjective Assessment - 07/10/19 1611    Subjective  I see my wrist MD tomorrow. No pain.    Pertinent History  ORIF Lt radius (06/04/19), ORIF Lt tibia (05/1119)  MD orders: NWB Lt LE, allow for active and passive ROM ankle, strengthening Lt LE, likely advance WB in 4 weeks    Currently in Pain?  No/denies         [facility] PT Assessment - 07/10/19 0001      AROM   Left Wrist Extension  30 Degrees    Left Wrist Flexion   43 Degrees      Strength   Left Hand Grip (lbs)  20                   [facility] Adult PT Treatment/Exercise - 07/10/19 0001      Knee/Hip Exercises: Aerobic   Nustep  L1 x 10 min      Knee/Hip Exercises: Seated   Long Arc Quad  Strengthening;Left;3 sets;10 reps    Long Arc Quad Weight  3 lbs.      Wrist Exercises   Other wrist exercises  red digi flex gripping 2x10       Vasopneumatic   Number Minutes Vasopneumatic   15 minutes    Vasopnuematic Location   Ankle    Vasopneumatic Pressure  Medium    Vasopneumatic Temperature   3 flakes      Manual Therapy   Manual Therapy  Soft tissue mobilization    Soft tissue mobilization  LT forearm and LTLE and ankle including retrograde massage             PT Education - 07/10/19 1656    Education Details  Hip sidelying abduction for HEP    Person(s) Educated  Patient    Methods  Explanation;Demonstration;Tactile cues;Verbal cues;Handout    Comprehension  Verbalized understanding;Returned demonstration       PT Short  Term Goals - 06/24/19 1518      PT SHORT TERM GOAL #1   Title  be independent in initial HEP    Time  4    Period  Weeks    Status  New    Target Date  07/22/19      PT SHORT TERM GOAL #2   Title  demonstrate Lt grip strength to > or = to 25# to improve ability to hold objects in the Lt hand    Time  4    Period  Weeks    Status  New    Target Date  07/22/19      PT SHORT TERM GOAL #3   Title  demonstrate gait with PWB as prescribed by MD with use of walker    Time  4    Period  Weeks    Status  New    Target Date  07/22/19        PT Long Term Goals - 06/24/19 1520      PT LONG TERM GOAL #1   Title  be independent in advanced HEP    Time  8    Period  Weeks    Status  New    Target Date  08/19/19      PT LONG TERM GOAL #2   Title  reduce FOTO for wrist to < or = to 36% limitation    Time  8    Period  Weeks    Status  New    Target Date  08/19/19      PT LONG TERM GOAL #3    Title  demonstrate Lt ankle strength to 4-/5 throughout to improve stability when allowed to weightbear with gait    Time  8    Period  Weeks    Status  New    Target Date  08/19/19      PT LONG TERM GOAL #4   Title  demonstrate Lt grip strength to > or  = to 45# to improve ability to hold objects in the Lt UE    Time  8    Period  Weeks    Status  New    Target Date  08/19/19      PT LONG TERM GOAL #5   Title  demonstrate > or = to 4/5 to 4+/5 Lt wrist strength throughout to improve endurance with use for ADLs and self-care    Time  8    Period  Weeks    Status  New    Target Date  08/19/19      Additional Long Term Goals   Additional Long Term Goals  Yes      PT LONG TERM GOAL #6   Title  ambulate with WBAT with cane or walker as prescribed by MD for all distances    Time  8    Period  Weeks    Status  New    Target Date  08/19/19            Plan - 07/10/19 1610    Clinical Impression Statement  Pt presents today pain free and improving Lt ankle edema. Pt's Lt wrist AROM and grip strength both improved since eval. Pt had no problem getting the Nustep started with her quads today and additionally performed knee extension with greater resistance. Conitnue to use [employer] at end of session for ankle edema.    Personal Factors and Comorbidities  Comorbidity 1  Comorbidities  fall and ORIF    Examination-Activity Limitations  Dressing;Locomotion Level;Stand    Examination-Participation Restrictions  Shop;Meal Prep    Stability/Clinical Decision Making  Evolving/Moderate complexity    Rehab Potential  Excellent    PT Frequency  2x / week    PT Duration  8 weeks    PT Treatment/Interventions  ADLs/Self Care Home Management;Cryotherapy;Electrical Stimulation;Aquatic Therapy;Moist Heat;Traction;Ultrasound;Neuromuscular re-education;Therapeutic exercise;Therapeutic activities;Gait training;Stair training;Functional mobility training;Patient/family education;Manual  techniques;Passive range of motion;Scar mobilization;Taping;Vasopneumatic Device    PT Next Visit Plan  Nustep, work on full Lt forearm supination, LT ankle edema and AROM, try BAPS board seated or towel floor exercises.    PT Home Exercise Plan  Access Code: [email or web address]       Patient will benefit from skilled therapeutic intervention in order to improve the following deficits and impairments:  Decreased activity tolerance, Decreased balance, Decreased mobility, Decreased strength, Impaired flexibility, Decreased scar mobility, Pain, Impaired UE functional use, Decreased endurance, Abnormal gait, Decreased range of motion, Difficulty walking  Visit Diagnosis: Pain in left wrist  Other abnormalities of gait and mobility  Muscle weakness (generalized)  Pain in left lower leg     Problem List Patient Active Problem List   Diagnosis Date Noted  . Fracture of tibial shaft, closed 06/07/2019  . Closed left pilon fracture, initial encounter 06/07/2019  . Fall 06/07/2019  . Closed fracture of left distal radius 06/04/2019    Anvi Mangal, PTA 07/10/2019, 4:58 PM  [facility] [address] [place], [place], [address] Phone: [phone]   Fax:  [phone]  Name: Regina Goodman MRN: [id number] Date of Birth: 02/04/1966

## 2019-07-12 ENCOUNTER — Ambulatory Visit: Payer: BC Managed Care – PPO | Admitting: Physical Therapy

## 2019-07-12 ENCOUNTER — Encounter: Payer: Self-pay | Admitting: Physical Therapy

## 2019-07-12 ENCOUNTER — Other Ambulatory Visit: Payer: Self-pay

## 2019-07-12 DIAGNOSIS — M25532 Pain in left wrist: Secondary | ICD-10-CM

## 2019-07-12 DIAGNOSIS — M79662 Pain in left lower leg: Secondary | ICD-10-CM

## 2019-07-12 DIAGNOSIS — M6281 Muscle weakness (generalized): Secondary | ICD-10-CM

## 2019-07-12 DIAGNOSIS — R2689 Other abnormalities of gait and mobility: Secondary | ICD-10-CM

## 2019-07-12 NOTE — Therapy (Signed)
[facility] [address] [place], [place], [address] Phone: [phone]   Fax:  [phone]  Physical Therapy Treatment  Patient Details  Name: Tanita Palinkas MRN: [id number] Date of Birth: 06-23-1965 Referring Provider (PT): Truitt Merle, MD   Encounter Date: 07/12/2019  PT End of Session - 07/12/19 1057    Visit Number  5    Date for PT Re-Evaluation  08/19/19    Authorization Type  [employer]    PT Start Time  1014    PT Stop Time  1115    PT Time Calculation (min)  61 min    Activity Tolerance  Patient tolerated treatment well    Behavior During Therapy  [facility] for tasks assessed/performed       History reviewed. No pertinent past medical history.  Past Surgical History:  Procedure Laterality Date  . EXTERNAL FIXATION LEG Left 06/04/2019   Procedure: EXTERNAL FIXATION LEG;  Surgeon: Bjorn Pippin, MD;  [facility];  Service: Orthopedics;  Laterality: Left;  . OPEN REDUCTION INTERNAL FIXATION (ORIF) TIBIA/FIBULA FRACTURE Left 06/06/2019   Procedure: OPEN REDUCTION INTERNAL FIXATION (ORIF) TIBIA/FIBULA FRACTURE;  Surgeon: Roby Lofts, MD;  [facility];  Service: Orthopedics;  Laterality: Left;  . ORIF WRIST FRACTURE Left 06/04/2019  . ORIF WRIST FRACTURE Left 06/04/2019   Procedure: OPEN REDUCTION INTERNAL FIXATION (ORIF) WRIST FRACTURE;  Surgeon: Bjorn Pippin, MD;  [facility];  Service: Orthopedics;  Laterality: Left;    There were no vitals filed for this visit.  Subjective Assessment - 07/12/19 1032    Subjective  Saw Dr Everardo Pacific for LT wrist. Pt is able to be out of her wrist brace 100% of the time and she can wean off the forearm walker attachment. He sent an order for OT bu tpt may desire to continue with her wrist treatment here. We will discuss on Mondays appt with PT.    Pertinent History  ORIF Lt radius (06/04/19), ORIF Lt tibia (05/1119)  MD orders: NWB Lt LE, allow for active and  passive ROM ankle, strengthening Lt LE, likely advance WB in 4 weeks    Currently in Pain?  No/denies                       [facility] Adult PT Treatment/Exercise - 07/12/19 0001      Knee/Hip Exercises: Aerobic   Nustep  L2 x 10 min with discussion of MD visit for her wrist fx      Ankle Exercises: Seated   Towel Inversion/Eversion  --   20x each direction, add to HEP   BAPS  Sitting;Level 2   2x10 in each direction with PTA guiding the board   Other Seated Ankle Exercises  Yellow tband DF and PF 2x10 each, VC ffor better control. Added to HEP    Other Seated Ankle Exercises  Press into teal pod 5 sec 10x, VC to feel the "balls of her feet"              PT Education - 07/12/19 1506    Education Details  HEP: seated towel for inversion and eversion, yellow tband DF and PF. PTA suggested some OTC Neosporin on a portal distal tibia that was looking more red and "puffy."    Person(s) Educated  Patient    Methods  Demonstration;Explanation;Verbal cues;Handout    Comprehension  Verbalized understanding;Returned demonstration       PT Short Term  Goals - 06/24/19 1518      PT SHORT TERM GOAL #1   Title  be independent in initial HEP    Time  4    Period  Weeks    Status  New    Target Date  07/22/19      PT SHORT TERM GOAL #2   Title  demonstrate Lt grip strength to > or = to 25# to improve ability to hold objects in the Lt hand    Time  4    Period  Weeks    Status  New    Target Date  07/22/19      PT SHORT TERM GOAL #3   Title  demonstrate gait with PWB as prescribed by MD with use of walker    Time  4    Period  Weeks    Status  New    Target Date  07/22/19        PT Long Term Goals - 06/24/19 1520      PT LONG TERM GOAL #1   Title  be independent in advanced HEP    Time  8    Period  Weeks    Status  New    Target Date  08/19/19      PT LONG TERM GOAL #2   Title  reduce FOTO for wrist to < or = to 36% limitation    Time  8    Period   Weeks    Status  New    Target Date  08/19/19      PT LONG TERM GOAL #3   Title  demonstrate Lt ankle strength to 4-/5 throughout to improve stability when allowed to weightbear with gait    Time  8    Period  Weeks    Status  New    Target Date  08/19/19      PT LONG TERM GOAL #4   Title  demonstrate Lt grip strength to > or  = to 45# to improve ability to hold objects in the Lt UE    Time  8    Period  Weeks    Status  New    Target Date  08/19/19      PT LONG TERM GOAL #5   Title  demonstrate > or = to 4/5 to 4+/5 Lt wrist strength throughout to improve endurance with use for ADLs and self-care    Time  8    Period  Weeks    Status  New    Target Date  08/19/19      Additional Long Term Goals   Additional Long Term Goals  Yes      PT LONG TERM GOAL #6   Title  ambulate with WBAT with cane or walker as prescribed by MD for all distances    Time  8    Period  Weeks    Status  New    Target Date  08/19/19            Plan - 07/12/19 1045    Clinical Impression Statement  Pt presents today post visit with Dr Everardo Pacific who is overseeing her wrist recovery. She presented an order for OT for her wrist therapy. Pt is unsure if she wants to do OT if she can continue at our office and have both ankle and wrist treated. She also has concerns regardin gher insurance if she has two areas treated at two different places. She reports  she was instructed to wean off both wrist brace and forearm attachment on her walker. No comment on whether the fracture is healed or healing. Lt ankle edema continues to improve/reduce. Pt has hard time with control/AROM during L2 BAPS exercises requiring moderate assistance from PTA. HEP for LT ankle was progressed today.    Personal Factors and Comorbidities  Comorbidity 1    Comorbidities  fall and ORIF    Examination-Activity Limitations  Dressing;Locomotion Level;Stand    Examination-Participation Restrictions  Shop;Meal Prep    Stability/Clinical  Decision Making  Evolving/Moderate complexity    Rehab Potential  Excellent    PT Frequency  2x / week    PT Duration  8 weeks    PT Treatment/Interventions  ADLs/Self Care Home Management;Cryotherapy;Electrical Stimulation;Aquatic Therapy;Moist Heat;Traction;Ultrasound;Neuromuscular re-education;Therapeutic exercise;Therapeutic activities;Gait training;Stair training;Functional mobility training;Patient/family education;Manual techniques;Passive range of motion;Scar mobilization;Taping;Vasopneumatic Device    PT Next Visit Plan  Update PT on wrist PT vs OT per MD orders. Continue with Lt ankle AROM and edema management.    PT Home Exercise Plan  Access Code: [email or web address]    Consulted and Agree with Plan of Care  Patient       Patient will benefit from skilled therapeutic intervention in order to improve the following deficits and impairments:  Decreased activity tolerance, Decreased balance, Decreased mobility, Decreased strength, Impaired flexibility, Decreased scar mobility, Pain, Impaired UE functional use, Decreased endurance, Abnormal gait, Decreased range of motion, Difficulty walking  Visit Diagnosis: Pain in left wrist  Other abnormalities of gait and mobility  Muscle weakness (generalized)  Pain in left lower leg     Problem List Patient Active Problem List   Diagnosis Date Noted  . Fracture of tibial shaft, closed 06/07/2019  . Closed left pilon fracture, initial encounter 06/07/2019  . Fall 06/07/2019  . Closed fracture of left distal radius 06/04/2019    Ajanay Farve, PTA 07/12/2019, 3:08 PM  [facility] [address] [place], [place], [address] Phone: [phone]   Fax:  [phone]  Name: Margareta Laureano MRN: [id number] Date of Birth: [birth year]

## 2019-07-15 ENCOUNTER — Ambulatory Visit: Payer: BC Managed Care – PPO | Admitting: Physical Therapy

## 2019-07-15 ENCOUNTER — Other Ambulatory Visit: Payer: Self-pay

## 2019-07-15 ENCOUNTER — Encounter: Payer: Self-pay | Admitting: Physical Therapy

## 2019-07-15 DIAGNOSIS — M25532 Pain in left wrist: Secondary | ICD-10-CM | POA: Diagnosis not present

## 2019-07-15 DIAGNOSIS — M6281 Muscle weakness (generalized): Secondary | ICD-10-CM

## 2019-07-15 DIAGNOSIS — M79662 Pain in left lower leg: Secondary | ICD-10-CM

## 2019-07-15 DIAGNOSIS — R2689 Other abnormalities of gait and mobility: Secondary | ICD-10-CM

## 2019-07-15 NOTE — Therapy (Signed)
[facility] [address] [place], [place], [address] Phone: [phone]   Fax:  [phone]  Physical Therapy Treatment  Patient Details  Name: Regina Goodman MRN: [id number] Date of Birth: [birth year] Referring Provider (PT): Truitt Merle, MD   Encounter Date: 07/15/2019  PT End of Session - 07/15/19 1538    Visit Number  6    Date for PT Re-Evaluation  08/19/19    Authorization Type  [employer]    PT Start Time  1526    PT Stop Time  1630    PT Time Calculation (min)  64 min    Activity Tolerance  Patient tolerated treatment well    Behavior During Therapy  [facility] for tasks assessed/performed       History reviewed. No pertinent past medical history.  Past Surgical History:  Procedure Laterality Date  . EXTERNAL FIXATION LEG Left 06/04/2019   Procedure: EXTERNAL FIXATION LEG;  Surgeon: Bjorn Pippin, MD;  Location: [facility];  Service: Orthopedics;  Laterality: Left;  . OPEN REDUCTION INTERNAL FIXATION (ORIF) TIBIA/FIBULA FRACTURE Left 06/06/2019   Procedure: OPEN REDUCTION INTERNAL FIXATION (ORIF) TIBIA/FIBULA FRACTURE;  Surgeon: Roby Lofts, MD;  Location: [facility];  Service: Orthopedics;  Laterality: Left;  . ORIF WRIST FRACTURE Left 06/04/2019  . ORIF WRIST FRACTURE Left 06/04/2019   Procedure: OPEN REDUCTION INTERNAL FIXATION (ORIF) WRIST FRACTURE;  Surgeon: Bjorn Pippin, MD;  [facility];  Service: Orthopedics;  Laterality: Left;    There were no vitals filed for this visit.  Subjective Assessment - 07/15/19 1536    Subjective  I could tell I had a leg workout last sesison. I am doing my new exercises just fine. I woul dlike to continue with my wrist therapy here becasue it is easier for my family.    Pertinent History  ORIF Lt radius (06/04/19), ORIF Lt tibia (05/1119)  MD orders: NWB Lt LE, allow for active and passive ROM ankle, strengthening Lt LE, likely advance WB in 4 weeks    Currently in  Pain?  No/denies    Multiple Pain Sites  No                       [facility] Adult PT Treatment/Exercise - 07/15/19 0001      Knee/Hip Exercises: Seated   Long Arc Quad  Strengthening;Left;2 sets;10 reps;Weights    Long Arc Quad Weight  4 lbs.      Wrist Exercises   Wrist Flexion  --   1# 10x   Wrist Extension  --   1# 10x   Other wrist exercises  AROM supination 2x10 post manual    Other wrist exercises  Thin velcro strip wrist flex/ext 5x each      Vasopneumatic   Number Minutes Vasopneumatic   10 minutes    Vasopnuematic Location   Ankle    Vasopneumatic Pressure  Medium    Vasopneumatic Temperature   3 fflake   Same parameters on 4/16     Manual Therapy   Soft tissue mobilization  LT bicep and elbow, PROM supination      Ankle Exercises: Seated   Towel Crunch  --   20x toes scrunches   BAPS  Sitting;Level 2   2x10 in each direction with PTA guiding the board/TC at quad              PT Short Term Goals - 06/24/19 1518  PT SHORT TERM GOAL #1   Title  be independent in initial HEP    Time  4    Period  Weeks    Status  New    Target Date  07/22/19      PT SHORT TERM GOAL #2   Title  demonstrate Lt grip strength to > or = to 25# to improve ability to hold objects in the Lt hand    Time  4    Period  Weeks    Status  New    Target Date  07/22/19      PT SHORT TERM GOAL #3   Title  demonstrate gait with PWB as prescribed by MD with use of walker    Time  4    Period  Weeks    Status  New    Target Date  07/22/19        PT Long Term Goals - 06/24/19 1520      PT LONG TERM GOAL #1   Title  be independent in advanced HEP    Time  8    Period  Weeks    Status  New    Target Date  08/19/19      PT LONG TERM GOAL #2   Title  reduce FOTO for wrist to < or = to 36% limitation    Time  8    Period  Weeks    Status  New    Target Date  08/19/19      PT LONG TERM GOAL #3   Title  demonstrate Lt ankle strength to 4-/5 throughout  to improve stability when allowed to weightbear with gait    Time  8    Period  Weeks    Status  New    Target Date  08/19/19      PT LONG TERM GOAL #4   Title  demonstrate Lt grip strength to > or  = to 45# to improve ability to hold objects in the Lt UE    Time  8    Period  Weeks    Status  New    Target Date  08/19/19      PT LONG TERM GOAL #5   Title  demonstrate > or = to 4/5 to 4+/5 Lt wrist strength throughout to improve endurance with use for ADLs and self-care    Time  8    Period  Weeks    Status  New    Target Date  08/19/19      Additional Long Term Goals   Additional Long Term Goals  Yes      PT LONG TERM GOAL #6   Title  ambulate with WBAT with cane or walker as prescribed by MD for all distances    Time  8    Period  Weeks    Status  New    Target Date  08/19/19              Patient will benefit from skilled therapeutic intervention in order to improve the following deficits and impairments:     Visit Diagnosis: Other abnormalities of gait and mobility  Pain in left wrist  Muscle weakness (generalized)  Pain in left lower leg     Problem List Patient Active Problem List   Diagnosis Date Noted  . Fracture of tibial shaft, closed 06/07/2019  . Closed left pilon fracture, initial encounter 06/07/2019  . Fall 06/07/2019  . Closed fracture of  left distal radius 06/04/2019    Regina Goodman, PTA 07/15/2019, 4:22 PM  [facility] [address] [place], [place], [address] Phone: [phone]   Fax:  [phone]  Name: Regina Goodman MRN: [id number] Date of Birth: 06/25/1965

## 2019-07-17 ENCOUNTER — Ambulatory Visit: Payer: BC Managed Care – PPO

## 2019-07-17 ENCOUNTER — Other Ambulatory Visit: Payer: Self-pay

## 2019-07-17 DIAGNOSIS — M25532 Pain in left wrist: Secondary | ICD-10-CM | POA: Diagnosis not present

## 2019-07-17 DIAGNOSIS — M6281 Muscle weakness (generalized): Secondary | ICD-10-CM

## 2019-07-17 DIAGNOSIS — M79662 Pain in left lower leg: Secondary | ICD-10-CM

## 2019-07-17 DIAGNOSIS — R2689 Other abnormalities of gait and mobility: Secondary | ICD-10-CM

## 2019-07-17 NOTE — Therapy (Signed)
[facility] [address] [place], [place], [address] Phone: [phone]   Fax:  [phone]  Physical Therapy Treatment  Patient Details  Name: Regina Goodman MRN: [id number] Date of Birth: 02/24/1966 Referring Provider (PT): Truitt Merle, MD   Encounter Date: 07/17/2019  PT End of Session - 07/17/19 1613    Visit Number  7    Date for PT Re-Evaluation  08/19/19    Authorization Type  [employer]    PT Start Time  1531    PT Stop Time  1628    PT Time Calculation (min)  57 min    Activity Tolerance  Patient tolerated treatment well    Behavior During Therapy  [facility] for tasks assessed/performed       History reviewed. No pertinent past medical history.  Past Surgical History:  Procedure Laterality Date  . EXTERNAL FIXATION LEG Left 06/04/2019   Procedure: EXTERNAL FIXATION LEG;  Surgeon: Bjorn Pippin, MD;  [facility];  Service: Orthopedics;  Laterality: Left;  . OPEN REDUCTION INTERNAL FIXATION (ORIF) TIBIA/FIBULA FRACTURE Left 06/06/2019   Procedure: OPEN REDUCTION INTERNAL FIXATION (ORIF) TIBIA/FIBULA FRACTURE;  Surgeon: Roby Lofts, MD;  [facility];  Service: Orthopedics;  Laterality: Left;  . ORIF WRIST FRACTURE Left 06/04/2019  . ORIF WRIST FRACTURE Left 06/04/2019   Procedure: OPEN REDUCTION INTERNAL FIXATION (ORIF) WRIST FRACTURE;  Surgeon: Bjorn Pippin, MD;  [facility];  Service: Orthopedics;  Laterality: Left;    There were no vitals filed for this visit.  Subjective Assessment - 07/17/19 1529    Subjective  I see the MD next week to determine WB status of the Lt LE.  I strained my Rt pec.    Currently in Pain?  No/denies                       [facility] Adult PT Treatment/Exercise - 07/17/19 0001      Exercises   Exercises  Hand      Knee/Hip Exercises: Aerobic   Nustep  L3 x 10 min       Knee/Hip Exercises: Seated   Long Arc Quad  Strengthening;Left;2  sets;10 reps;Weights    Long Arc Quad Weight  4 lbs.    Abd/Adduction Limitations  Prostretch: x 2 minutes      Hand Exercises   Digiticizer  mass gripx 2 minutes, individual fingers x 2 minutes       Wrist Exercises   Other wrist exercises  velcro board- thin strip: supination/pronation  x10    Other wrist exercises  Thin velcro strip wrist flex/ext 10 each      Vasopneumatic   Number Minutes Vasopneumatic   10 minutes    Vasopnuematic Location   Ankle    Vasopneumatic Pressure  Medium    Vasopneumatic Temperature   3 flake      Ankle Exercises: Seated   [place] Pickup  1 cup    BAPS  Sitting;Level 2   2x10 in each direction (4 ways)-assist by PT to control              PT Short Term Goals - 06/24/19 1518      PT SHORT TERM GOAL #1   Title  be independent in initial HEP    Time  4    Period  Weeks    Status  New    Target Date  07/22/19  PT SHORT TERM GOAL #2   Title  demonstrate Lt grip strength to > or = to 25# to improve ability to hold objects in the Lt hand    Time  4    Period  Weeks    Status  New    Target Date  07/22/19      PT SHORT TERM GOAL #3   Title  demonstrate gait with PWB as prescribed by MD with use of walker    Time  4    Period  Weeks    Status  New    Target Date  07/22/19        PT Long Term Goals - 06/24/19 1520      PT LONG TERM GOAL #1   Title  be independent in advanced HEP    Time  8    Period  Weeks    Status  New    Target Date  08/19/19      PT LONG TERM GOAL #2   Title  reduce FOTO for wrist to < or = to 36% limitation    Time  8    Period  Weeks    Status  New    Target Date  08/19/19      PT LONG TERM GOAL #3   Title  demonstrate Lt ankle strength to 4-/5 throughout to improve stability when allowed to weightbear with gait    Time  8    Period  Weeks    Status  New    Target Date  08/19/19      PT LONG TERM GOAL #4   Title  demonstrate Lt grip strength to > or  = to 45# to improve ability to hold  objects in the Lt UE    Time  8    Period  Weeks    Status  New    Target Date  08/19/19      PT LONG TERM GOAL #5   Title  demonstrate > or = to 4/5 to 4+/5 Lt wrist strength throughout to improve endurance with use for ADLs and self-care    Time  8    Period  Weeks    Status  New    Target Date  08/19/19      Additional Long Term Goals   Additional Long Term Goals  Yes      PT LONG TERM GOAL #6   Title  ambulate with WBAT with cane or walker as prescribed by MD for all distances    Time  8    Period  Weeks    Status  New    Target Date  08/19/19            Plan - 07/17/19 1600    Clinical Impression Statement  Pt continues to be NWB on the Lt LE and use platform for Lt wrist.  Pt will see MD next week to determine Lt LE WB status.  It will be less stress on Lt wrist to wean from platform if Lt LE WB status changes.  Pt requires moderate assistance for control of Baps board with inversion/eversion and CCW circles.  Pt was able to do Velcro board with small handle grip and addition of supination/pronation.  PT will continue to benefit from skilled PT to address Lt ankle strength, gait progression when allowed by MD, wrist and hand strength.    PT Frequency  2x / week    PT Duration  8 weeks  PT Treatment/Interventions  ADLs/Self Care Home Management;Cryotherapy;Electrical Stimulation;Aquatic Therapy;Moist Heat;Traction;Ultrasound;Neuromuscular re-education;Therapeutic exercise;Therapeutic activities;Gait training;Stair training;Functional mobility training;Patient/family education;Manual techniques;Passive range of motion;Scar mobilization;Taping;Vasopneumatic Device    PT Next Visit Plan  Lt ankle ROM, NWB strength, Lt wrist and hand strength.  Pt sees MD 4/27- write note       Patient will benefit from skilled therapeutic intervention in order to improve the following deficits and impairments:  Decreased activity tolerance, Decreased balance, Decreased mobility, Decreased  strength, Impaired flexibility, Decreased scar mobility, Pain, Impaired UE functional use, Decreased endurance, Abnormal gait, Decreased range of motion, Difficulty walking  Visit Diagnosis: Other abnormalities of gait and mobility  Pain in left wrist  Muscle weakness (generalized)  Pain in left lower leg     Problem List Patient Active Problem List   Diagnosis Date Noted  . Fracture of tibial shaft, closed 06/07/2019  . Closed left pilon fracture, initial encounter 06/07/2019  . Fall 06/07/2019  . Closed fracture of left distal radius 06/04/2019    Tuff Clabo 07/17/2019, 4:18 PM  [facility] [address], [place], [place], [address] Phone: [phone]   Fax:  [phone]  Name: Regina Goodman MRN: [id number] Date of Birth: April 19, 1965

## 2019-07-22 ENCOUNTER — Other Ambulatory Visit: Payer: Self-pay

## 2019-07-22 ENCOUNTER — Encounter: Payer: Self-pay | Admitting: Physical Therapy

## 2019-07-22 ENCOUNTER — Ambulatory Visit: Payer: BC Managed Care – PPO | Admitting: Physical Therapy

## 2019-07-22 DIAGNOSIS — M6281 Muscle weakness (generalized): Secondary | ICD-10-CM

## 2019-07-22 DIAGNOSIS — R2689 Other abnormalities of gait and mobility: Secondary | ICD-10-CM

## 2019-07-22 DIAGNOSIS — M25532 Pain in left wrist: Secondary | ICD-10-CM | POA: Diagnosis not present

## 2019-07-22 DIAGNOSIS — M79662 Pain in left lower leg: Secondary | ICD-10-CM

## 2019-07-22 NOTE — Therapy (Signed)
[facility] [address], [place], [place], [address] Phone: [phone]   Fax:  [phone]  Physical Therapy Treatment  Patient Details  Name: Regina Goodman MRN: [id number] Date of Birth: [birth year] Referring Provider (PT): Katha Hamming, MD   Encounter Date: 07/22/2019  PT End of Session - 07/22/19 1522    Visit Number  8    Date for PT Re-Evaluation  08/19/19    Authorization Type  [place]    PT Start Time  1522    PT Stop Time  1615    PT Time Calculation (min)  53 min    Activity Tolerance  Patient tolerated treatment well    Behavior During Therapy  [facility] for tasks assessed/performed       History reviewed. No pertinent past medical history.  Past Surgical History:  Procedure Laterality Date  . EXTERNAL FIXATION LEG Left 06/04/2019   Procedure: EXTERNAL FIXATION LEG;  Surgeon: Hiram Gash, MD;  Location: [place];  Service: Orthopedics;  Laterality: Left;  . OPEN REDUCTION INTERNAL FIXATION (ORIF) TIBIA/FIBULA FRACTURE Left 06/06/2019   Procedure: OPEN REDUCTION INTERNAL FIXATION (ORIF) TIBIA/FIBULA FRACTURE;  Surgeon: Shona Needles, MD;  Location: [place];  Service: Orthopedics;  Laterality: Left;  . ORIF WRIST FRACTURE Left 06/04/2019  . ORIF WRIST FRACTURE Left 06/04/2019   Procedure: OPEN REDUCTION INTERNAL FIXATION (ORIF) WRIST FRACTURE;  Surgeon: Hiram Gash, MD;  Location: [place];  Service: Orthopedics;  Laterality: Left;    There were no vitals filed for this visit.  Subjective Assessment - 07/22/19 1523    Subjective  I go to Dr Doreatha Martin tomorrow. My wrist is sore from using it.    Pertinent History  ORIF Lt radius (06/04/19), ORIF Lt tibia (05/1119)  MD orders: NWB Lt LE, allow for active and passive ROM ankle, strengthening Lt LE, likely advance WB in 4 weeks    Currently in Pain?  --   My Lt wrist is just sore from using it more. Not pain        [facility] PT Assessment - 07/22/19 0001       AROM   Overall AROM Comments  Ankle DF 16 degrees active, PF 41 degrees, Inv 40 degrees, Eversion 15 degrees    Left Wrist Extension  30 Degrees    Left Wrist Flexion  43 Degrees      Strength   Left Hand Grip (lbs)  20                   [facility] Adult PT Treatment/Exercise - 07/22/19 0001      Knee/Hip Exercises: Aerobic   Nustep  L3 x 10 min       Wrist Exercises   Other wrist exercises  Velcro board- thick strip supination 10-15x; 4# ankle weight on LT upper trap to help inhibit     Other wrist exercises  Thin velcro strip wrist flex/ext 10 each      Vasopneumatic   Number Minutes Vasopneumatic   10 minutes    Vasopnuematic Location   Ankle    Vasopneumatic Pressure  Medium    Vasopneumatic Temperature   3 flake      Manual Therapy   Soft tissue mobilization  Lt ankle retrograde massage, gastroc soft tissue work               PT Short Term Goals - 07/22/19 1552      PT SHORT TERM GOAL #1  Title  be independent in initial HEP    Time  4    Period  Weeks    Status  Achieved    Target Date  07/22/19      PT SHORT TERM GOAL #2   Title  demonstrate Lt grip strength to > or = to 25# to improve ability to hold objects in the Lt hand    Time  4    Period  Weeks    Status  On-going   20#       PT Long Term Goals - 06/24/19 1520      PT LONG TERM GOAL #1   Title  be independent in advanced HEP    Time  8    Period  Weeks    Status  New    Target Date  08/19/19      PT LONG TERM GOAL #2   Title  reduce FOTO for wrist to < or = to 36% limitation    Time  8    Period  Weeks    Status  New    Target Date  08/19/19      PT LONG TERM GOAL #3   Title  demonstrate Lt ankle strength to 4-/5 throughout to improve stability when allowed to weightbear with gait    Time  8    Period  Weeks    Status  New    Target Date  08/19/19      PT LONG TERM GOAL #4   Title  demonstrate Lt grip strength to > or  = to 45# to improve ability to hold  objects in the Lt UE    Time  8    Period  Weeks    Status  New    Target Date  08/19/19      PT LONG TERM GOAL #5   Title  demonstrate > or = to 4/5 to 4+/5 Lt wrist strength throughout to improve endurance with use for ADLs and self-care    Time  8    Period  Weeks    Status  New    Target Date  08/19/19      Additional Long Term Goals   Additional Long Term Goals  Yes      PT LONG TERM GOAL #6   Title  ambulate with WBAT with cane or walker as prescribed by MD for all distances    Time  8    Period  Weeks    Status  New    Target Date  08/19/19            Plan - 07/22/19 1546    Clinical Impression Statement  Pt's ankle AROM  improving. Seeing MD tomorrow to assess LT ankle. Pt has difficluty with LT supination and needs tactile cuing to inhibit using her shoulder/scapula. pt did well placing a 4# weight on her upper trap.    Personal Factors and Comorbidities  Comorbidity 1    Comorbidities  fall and ORIF    Examination-Activity Limitations  Dressing;Locomotion Level;Stand    Examination-Participation Restrictions  Shop;Meal Prep    Stability/Clinical Decision Making  Evolving/Moderate complexity    Rehab Potential  Excellent    PT Frequency  2x / week    PT Duration  8 weeks    PT Treatment/Interventions  ADLs/Self Care Home Management;Cryotherapy;Electrical Stimulation;Aquatic Therapy;Moist Heat;Traction;Ultrasound;Neuromuscular re-education;Therapeutic exercise;Therapeutic activities;Gait training;Stair training;Functional mobility training;Patient/family education;Manual techniques;Passive range of motion;Scar mobilization;Taping;Vasopneumatic Device    PT Next Visit  Plan  To MD, see what MD says    PT Home Exercise Plan  Access Code: [email or web address]    Consulted and Agree with Plan of Care  Patient       Patient will benefit from skilled therapeutic intervention in order to improve the following deficits and impairments:  Decreased activity tolerance, Decreased  balance, Decreased mobility, Decreased strength, Impaired flexibility, Decreased scar mobility, Pain, Impaired UE functional use, Decreased endurance, Abnormal gait, Decreased range of motion, Difficulty walking  Visit Diagnosis: Other abnormalities of gait and mobility  Pain in left wrist  Muscle weakness (generalized)  Pain in left lower leg     Problem List Patient Active Problem List   Diagnosis Date Noted  . Fracture of tibial shaft, closed 06/07/2019  . Closed left pilon fracture, initial encounter 06/07/2019  . Fall 06/07/2019  . Closed fracture of left distal radius 06/04/2019    Clementine Soulliere, PTA 07/22/2019, 4:00 PM   [facility] [address] [place], [place], [address] Phone: [phone]   Fax:  [phone]  Name: Willadean Guyton MRN: [id number] Date of Birth: 14-Sep-1965

## 2019-07-24 ENCOUNTER — Other Ambulatory Visit: Payer: Self-pay

## 2019-07-24 ENCOUNTER — Ambulatory Visit: Payer: BC Managed Care – PPO | Admitting: Physical Therapy

## 2019-07-24 ENCOUNTER — Encounter: Payer: Self-pay | Admitting: Physical Therapy

## 2019-07-24 DIAGNOSIS — M79662 Pain in left lower leg: Secondary | ICD-10-CM

## 2019-07-24 DIAGNOSIS — R2689 Other abnormalities of gait and mobility: Secondary | ICD-10-CM

## 2019-07-24 DIAGNOSIS — M25532 Pain in left wrist: Secondary | ICD-10-CM | POA: Diagnosis not present

## 2019-07-24 DIAGNOSIS — M6281 Muscle weakness (generalized): Secondary | ICD-10-CM

## 2019-07-24 NOTE — Therapy (Signed)
[facility] [address] [place], [place], [address] Phone: [phone]   Fax:  [phone]  Physical Therapy Treatment  Patient Details  Name: Regina Goodman MRN: [id number] Date of Birth: [birth year] Referring Provider (PT): Truitt Merle, MD   Encounter Date: 07/24/2019  PT End of Session - 07/24/19 1521    Visit Number  9    Date for PT Re-Evaluation  08/19/19    Authorization Type  [employer]    PT Start Time  1521    PT Stop Time  1615    PT Time Calculation (min)  54 min    Activity Tolerance  Patient tolerated treatment well    Behavior During Therapy  [facility] for tasks assessed/performed       History reviewed. No pertinent past medical history.  Past Surgical History:  Procedure Laterality Date  . EXTERNAL FIXATION LEG Left 06/04/2019   Procedure: EXTERNAL FIXATION LEG;  Surgeon: Bjorn Pippin, MD;  Location: [facility];  Service: Orthopedics;  Laterality: Left;  . OPEN REDUCTION INTERNAL FIXATION (ORIF) TIBIA/FIBULA FRACTURE Left 06/06/2019   Procedure: OPEN REDUCTION INTERNAL FIXATION (ORIF) TIBIA/FIBULA FRACTURE;  Surgeon: Roby Lofts, MD;  Location: [facility];  Service: Orthopedics;  Laterality: Left;  . ORIF WRIST FRACTURE Left 06/04/2019  . ORIF WRIST FRACTURE Left 06/04/2019   Procedure: OPEN REDUCTION INTERNAL FIXATION (ORIF) WRIST FRACTURE;  Surgeon: Bjorn Pippin, MD;  Location: [facility];  Service: Orthopedics;  Laterality: Left;    There were no vitals filed for this visit.  Subjective Assessment - 07/24/19 1522    Subjective  MD pleased with progress and healing. PT is allowed WBAT in boot. Will stay in boot until next MD 08/20/19. Ok to wean to cane as tolerated. Pt has taken off the forearm attachment and is now weight bearing throuh her Lt wrist. No pain either places.    Pertinent History  ORIF Lt radius (06/04/19), ORIF Lt tibia (05/1119)  MD orders: NWB Lt LE, allow for active and passive ROM  ankle, strengthening Lt LE, likely advance WB in 4 weeks    Currently in Pain?  No/denies         [facility] PT Assessment - 07/24/19 0001      Restrictions   Weight Bearing Restrictions  Yes    LUE Weight Bearing  Weight bearing as tolerated    LLE Weight Bearing  Weight bearing as tolerated                   [facility] Adult PT Treatment/Exercise - 07/24/19 0001      Ambulation/Gait   Ambulation Distance (Feet)  100 Feet    Gait Comments  Level surface with standard cane, 2 pt gait patterrn in CAM boot.       Vasopneumatic   Number Minutes Vasopneumatic   10 minutes    Vasopnuematic Location   Ankle    Vasopneumatic Pressure  Medium    Vasopneumatic Temperature   3 flakes      Manual Therapy   Soft tissue mobilization  Lt ankle retrograde massage, gastroc soft tissue work    Passive ROM  PROM Lt wrist in all planes 3x10   AAROM for Lt ankle eversion 3x10     Ankle Exercises: Seated   BAPS  Sitting;Level 2;10 reps   2 sets VC to not move femur   Other Seated Ankle Exercises  Red band PF/DF 2x10 each Isometric  PT Short Term Goals - 07/22/19 1552      PT SHORT TERM GOAL #1   Title  be independent in initial HEP    Time  4    Period  Weeks    Status  Achieved    Target Date  07/22/19      PT SHORT TERM GOAL #2   Title  demonstrate Lt grip strength to > or = to 25# to improve ability to hold objects in the Lt hand    Time  4    Period  Weeks    Status  On-going   20#       PT Long Term Goals - 06/24/19 1520      PT LONG TERM GOAL #1   Title  be independent in advanced HEP    Time  8    Period  Weeks    Status  New    Target Date  08/19/19      PT LONG TERM GOAL #2   Title  reduce FOTO for wrist to < or = to 36% limitation    Time  8    Period  Weeks    Status  New    Target Date  08/19/19      PT LONG TERM GOAL #3   Title  demonstrate Lt ankle strength to 4-/5 throughout to improve stability when allowed to weightbear  with gait    Time  8    Period  Weeks    Status  New    Target Date  08/19/19      PT LONG TERM GOAL #4   Title  demonstrate Lt grip strength to > or  = to 45# to improve ability to hold objects in the Lt UE    Time  8    Period  Weeks    Status  New    Target Date  08/19/19      PT LONG TERM GOAL #5   Title  demonstrate > or = to 4/5 to 4+/5 Lt wrist strength throughout to improve endurance with use for ADLs and self-care    Time  8    Period  Weeks    Status  New    Target Date  08/19/19      Additional Long Term Goals   Additional Long Term Goals  Yes      PT LONG TERM GOAL #6   Title  ambulate with WBAT with cane or walker as prescribed by MD for all distances    Time  8    Period  Weeks    Status  New    Target Date  08/19/19            Plan - 07/24/19 1521    Clinical Impression Statement  Pt got a good report from her MD regarding the healing of her ankle. Fracture is "healing." She is allowed WBAT in her CAM boot and can wean to her cane as tolerated. Pt is to stay in her boot until next MD in May. Pt was educated in 2 pt gait pattern using the standard cane. Pt has full PROM for Lt supination and wrist extension. She remains tight in wrist flexion. No pain with todays  treatment. .    Personal Factors and Comorbidities  Comorbidity 1    Comorbidities  fall and ORIF    Examination-Activity Limitations  Dressing;Locomotion Level;Stand    Examination-Participation Restrictions  Shop;Meal Prep    Stability/Clinical Decision  Making  Evolving/Moderate complexity    Rehab Potential  Excellent    PT Frequency  2x / week    PT Duration  8 weeks    PT Treatment/Interventions  ADLs/Self Care Home Management;Cryotherapy;Electrical Stimulation;Aquatic Therapy;Moist Heat;Traction;Ultrasound;Neuromuscular re-education;Therapeutic exercise;Therapeutic activities;Gait training;Stair training;Functional mobility training;Patient/family education;Manual techniques;Passive range  of motion;Scar mobilization;Taping;Vasopneumatic Device    PT Next Visit Plan  Conitnue with Lt ankle AROM specifically inversion and eversion. Lt wrist strength and gait with cane in boot.    PT Home Exercise Plan  Access Code: [email or web address]    Consulted and Agree with Plan of Care  Patient       Patient will benefit from skilled therapeutic intervention in order to improve the following deficits and impairments:  Decreased activity tolerance, Decreased balance, Decreased mobility, Decreased strength, Impaired flexibility, Decreased scar mobility, Pain, Impaired UE functional use, Decreased endurance, Abnormal gait, Decreased range of motion, Difficulty walking  Visit Diagnosis: Other abnormalities of gait and mobility  Pain in left wrist  Muscle weakness (generalized)  Pain in left lower leg     Problem List Patient Active Problem List   Diagnosis Date Noted  . Fracture of tibial shaft, closed 06/07/2019  . Closed left pilon fracture, initial encounter 06/07/2019  . Fall 06/07/2019  . Closed fracture of left distal radius 06/04/2019    Regina Goodman, PTA 07/24/2019, 4:13 PM  [facility] [address] [place], [place], [address] Phone: [phone]   Fax:  [phone]  Name: Regina Goodman MRN: [id number] Date of Birth: [birth year]

## 2019-07-29 ENCOUNTER — Other Ambulatory Visit: Payer: Self-pay

## 2019-07-29 ENCOUNTER — Encounter: Payer: Self-pay | Admitting: Physical Therapy

## 2019-07-29 ENCOUNTER — Ambulatory Visit: Payer: BC Managed Care – PPO | Attending: Student | Admitting: Physical Therapy

## 2019-07-29 DIAGNOSIS — M6281 Muscle weakness (generalized): Secondary | ICD-10-CM | POA: Insufficient documentation

## 2019-07-29 DIAGNOSIS — M25532 Pain in left wrist: Secondary | ICD-10-CM | POA: Diagnosis present

## 2019-07-29 DIAGNOSIS — M79662 Pain in left lower leg: Secondary | ICD-10-CM | POA: Diagnosis present

## 2019-07-29 DIAGNOSIS — R2689 Other abnormalities of gait and mobility: Secondary | ICD-10-CM | POA: Insufficient documentation

## 2019-07-29 NOTE — Therapy (Signed)
[facility] [address] [place], [place], [address] Phone: [phone]   Fax:  [phone]  Physical Therapy Treatment  Patient Details  Name: Regina Goodman MRN: [id number] Date of Birth: 1965-12-22 Referring Provider (PT): Katha Hamming, MD   Encounter Date: 07/29/2019  PT End of Session - 07/29/19 1530    Visit Number  10    Date for PT Re-Evaluation  08/19/19    Authorization Type  [place]    PT Start Time  1528    PT Stop Time  1620    PT Time Calculation (min)  52 min    Activity Tolerance  Patient tolerated treatment well    Behavior During Therapy  [facility] for tasks assessed/performed       History reviewed. No pertinent past medical history.  Past Surgical History:  Procedure Laterality Date  . EXTERNAL FIXATION LEG Left 06/04/2019   Procedure: EXTERNAL FIXATION LEG;  Surgeon: Hiram Gash, MD;  Location: [place];  Service: Orthopedics;  Laterality: Left;  . OPEN REDUCTION INTERNAL FIXATION (ORIF) TIBIA/FIBULA FRACTURE Left 06/06/2019   Procedure: OPEN REDUCTION INTERNAL FIXATION (ORIF) TIBIA/FIBULA FRACTURE;  Surgeon: Shona Needles, MD;  Location: [place];  Service: Orthopedics;  Laterality: Left;  . ORIF WRIST FRACTURE Left 06/04/2019  . ORIF WRIST FRACTURE Left 06/04/2019   Procedure: OPEN REDUCTION INTERNAL FIXATION (ORIF) WRIST FRACTURE;  Surgeon: Hiram Gash, MD;  Location: [place];  Service: Orthopedics;  Laterality: Left;    There were no vitals filed for this visit.  Subjective Assessment - 07/29/19 1531    Subjective  Pt ambulating with standard cane and CAM boot utilizing 2 pt gait pattern. No pain.    Pertinent History  ORIF Lt radius (06/04/19), ORIF Lt tibia (05/1119)  MD orders: NWB Lt LE, allow for active and passive ROM ankle, strengthening Lt LE, likely advance WB in 4 weeks    Currently in Pain?  No/denies         [facility] PT Assessment - 07/29/19 0001      Strength   Left  Hand Grip (lbs)  27#                   [facility] Adult PT Treatment/Exercise - 07/29/19 0001      Knee/Hip Exercises: Aerobic   Nustep  L3 x 10 min    Pt present to discuss status     Wrist Exercises   Wrist Flexion  Strengthening;Left;20 reps    Bar Weights/Barbell (Wrist Flexion)  1 lb    Wrist Extension  Strengthening;Left;20 reps;Seated;Bar weights/barbell    Bar Weights/Barbell (Wrist Extension)  1 lb    Other wrist exercises  Thick velcro strip 10x supination, wrist flex/ect   VC to settle LT shoulder     Vasopneumatic   Number Minutes Vasopneumatic   1 minutes    Vasopnuematic Location   Ankle    Vasopneumatic Pressure  Medium    Vasopneumatic Temperature   3 flakes      Ankle Exercises: Supine   T-Band  DF/PF 2x10 green band    Progressed green for HEP   Other Supine Ankle Exercises  Active ROM 10x inv/ev, then 2 finger resistance for inv/Ev 10x each               PT Short Term Goals - 07/29/19 1533      PT SHORT TERM GOAL #2   Title  demonstrate Lt grip strength to >  or = to 25# to improve ability to hold objects in the Lt hand    Time  4    Period  Weeks    Status  Achieved   27#   Target Date  07/22/19      PT SHORT TERM GOAL #3   Title  demonstrate gait with PWB as prescribed by MD with use of walker    Time  4    Period  Weeks    Status  Achieved        PT Long Term Goals - 06/24/19 1520      PT LONG TERM GOAL #1   Title  be independent in advanced HEP    Time  8    Period  Weeks    Status  New    Target Date  08/19/19      PT LONG TERM GOAL #2   Title  reduce FOTO for wrist to < or = to 36% limitation    Time  8    Period  Weeks    Status  New    Target Date  08/19/19      PT LONG TERM GOAL #3   Title  demonstrate Lt ankle strength to 4-/5 throughout to improve stability when allowed to weightbear with gait    Time  8    Period  Weeks    Status  New    Target Date  08/19/19      PT LONG TERM GOAL #4   Title   demonstrate Lt grip strength to > or  = to 45# to improve ability to hold objects in the Lt UE    Time  8    Period  Weeks    Status  New    Target Date  08/19/19      PT LONG TERM GOAL #5   Title  demonstrate > or = to 4/5 to 4+/5 Lt wrist strength throughout to improve endurance with use for ADLs and self-care    Time  8    Period  Weeks    Status  New    Target Date  08/19/19      Additional Long Term Goals   Additional Long Term Goals  Yes      PT LONG TERM GOAL #6   Title  ambulate with WBAT with cane or walker as prescribed by MD for all distances    Time  8    Period  Weeks    Status  New    Target Date  08/19/19            Plan - 07/29/19 1530    Clinical Impression Statement  All short term goals met at this time as pt's grip improved to 27# on the LT. Progressed home band to green for ankle DF/PF. Pt tolerated 1# for wrist flex/ext today, all no pain. Pt's ankle was more swollen today she reports being "out more today" with her leg dependent. Increased [place] to 10 minutes to reduce edema. Pt is now ambulating with standard cane in her CAM boot.    Personal Factors and Comorbidities  Comorbidity 1    Comorbidities  fall and ORIF    Examination-Activity Limitations  Dressing;Locomotion Level;Stand    Examination-Participation Restrictions  Shop;Meal Prep    Stability/Clinical Decision Making  Evolving/Moderate complexity    Rehab Potential  Excellent    PT Frequency  2x / week    PT Duration  8 weeks  PT Treatment/Interventions  ADLs/Self Care Home Management;Cryotherapy;Electrical Stimulation;Aquatic Therapy;Moist Heat;Traction;Ultrasound;Neuromuscular re-education;Therapeutic exercise;Therapeutic activities;Gait training;Stair training;Functional mobility training;Patient/family education;Manual techniques;Passive range of motion;Scar mobilization;Taping;Vasopneumatic Device    PT Next Visit Plan  Conitnue with Lt ankle AROM specifically inversion and  eversion. Lt wrist strength and gait with cane in boot.    PT Home Exercise Plan  Access Code: [email or web address]    Consulted and Agree with Plan of Care  Patient       Patient will benefit from skilled therapeutic intervention in order to improve the following deficits and impairments:  Decreased activity tolerance, Decreased balance, Decreased mobility, Decreased strength, Impaired flexibility, Decreased scar mobility, Pain, Impaired UE functional use, Decreased endurance, Abnormal gait, Decreased range of motion, Difficulty walking  Visit Diagnosis: Other abnormalities of gait and mobility  Pain in left wrist  Muscle weakness (generalized)  Pain in left lower leg     Problem List Patient Active Problem List   Diagnosis Date Noted  . Fracture of tibial shaft, closed 06/07/2019  . Closed left pilon fracture, initial encounter 06/07/2019  . Fall 06/07/2019  . Closed fracture of left distal radius 06/04/2019    Laporchia Nakajima, PTA 07/29/2019, 4:08 PM  [facility] [address], [place], [place], [address] Phone: [phone]   Fax:  [phone]  Name: Regina Goodman MRN: [id number] Date of Birth: 1965/06/17

## 2019-07-31 ENCOUNTER — Ambulatory Visit: Payer: BC Managed Care – PPO

## 2019-07-31 ENCOUNTER — Other Ambulatory Visit: Payer: Self-pay

## 2019-07-31 DIAGNOSIS — M6281 Muscle weakness (generalized): Secondary | ICD-10-CM

## 2019-07-31 DIAGNOSIS — R2689 Other abnormalities of gait and mobility: Secondary | ICD-10-CM | POA: Diagnosis not present

## 2019-07-31 DIAGNOSIS — M79662 Pain in left lower leg: Secondary | ICD-10-CM

## 2019-07-31 DIAGNOSIS — M25532 Pain in left wrist: Secondary | ICD-10-CM

## 2019-07-31 NOTE — Therapy (Signed)
[facility] [address], [place], [place], [address] Phone: [phone]   Fax:  [phone]  Physical Therapy Treatment  Patient Details  Name: Regina Goodman MRN: [id number] Date of Birth: March 06, 1966 Referring Provider (PT): Katha Hamming, MD   Encounter Date: 07/31/2019  PT End of Session - 07/31/19 9480    Visit Number  11    Date for PT Re-Evaluation  08/19/19    Authorization Type  [place]    PT Start Time  1525    PT Stop Time  1622    PT Time Calculation (min)  57 min    Activity Tolerance  Patient tolerated treatment well    Behavior During Therapy  [facility] for tasks assessed/performed       History reviewed. No pertinent past medical history.  Past Surgical History:  Procedure Laterality Date  . EXTERNAL FIXATION LEG Left 06/04/2019   Procedure: EXTERNAL FIXATION LEG;  Surgeon: Hiram Gash, MD;  Location: [place];  Service: Orthopedics;  Laterality: Left;  . OPEN REDUCTION INTERNAL FIXATION (ORIF) TIBIA/FIBULA FRACTURE Left 06/06/2019   Procedure: OPEN REDUCTION INTERNAL FIXATION (ORIF) TIBIA/FIBULA FRACTURE;  Surgeon: Shona Needles, MD;  Location: [place];  Service: Orthopedics;  Laterality: Left;  . ORIF WRIST FRACTURE Left 06/04/2019  . ORIF WRIST FRACTURE Left 06/04/2019   Procedure: OPEN REDUCTION INTERNAL FIXATION (ORIF) WRIST FRACTURE;  Surgeon: Hiram Gash, MD;  Location: [place];  Service: Orthopedics;  Laterality: Left;    There were no vitals filed for this visit.  Subjective Assessment - 07/31/19 1521    Subjective  I'm not wearing my wrist brace now.    Pertinent History  ORIF Lt radius (06/04/19), ORIF Lt tibia (05/1119)  MD orders: NWB Lt LE, allow for active and passive ROM ankle, strengthening Lt LE, likely advance WB in 4 weeks    Currently in Pain?  No/denies         [facility] PT Assessment - 07/31/19 0001      ROM / Strength   AROM / PROM / Strength  PROM      PROM   PROM  Assessment Site  Ankle    Right/Left Ankle  Left    Left Ankle Dorsiflexion  14    Left Ankle Plantar Flexion  40    Left Ankle Inversion  36    Left Ankle Eversion  15                   [facility] Adult PT Treatment/Exercise - 07/31/19 0001      Knee/Hip Exercises: Aerobic   Nustep  L3 x 10 min    PT present to discuss status     Hand Exercises   Digiticizer  mass gripx 2 minutes, individual fingers x 2 minutes       Wrist Exercises   Wrist Flexion  --    Bar Weights/Barbell (Wrist Flexion)  --    Wrist Extension  --    Bar Weights/Barbell (Wrist Extension)  --    Other wrist exercises  Thick velcro strip 10x supination, wrist flex/ect   verbal cues to reduce scapular elevation     Vasopneumatic   Number Minutes Vasopneumatic   10 minutes    Vasopnuematic Location   Ankle    Vasopneumatic Pressure  Medium    Vasopneumatic Temperature   3 flakes      Manual Therapy   Manual Therapy  Soft tissue mobilization  Passive ROM  Lt ankle in all directions to tolerance      Ankle Exercises: Supine   T-Band  DF/PF 2x10 green band, inversion and eversion 2x10- tactile cues to reduce substitution       Ankle Exercises: Seated   BAPS  Sitting;Level 2   x 1 minute- 4 directions              PT Short Term Goals - 07/29/19 1533      PT SHORT TERM GOAL #2   Title  demonstrate Lt grip strength to > or = to 25# to improve ability to hold objects in the Lt hand    Time  4    Period  Weeks    Status  Achieved   27#   Target Date  07/22/19      PT SHORT TERM GOAL #3   Title  demonstrate gait with PWB as prescribed by MD with use of walker    Time  4    Period  Weeks    Status  Achieved        PT Long Term Goals - 06/24/19 1520      PT LONG TERM GOAL #1   Title  be independent in advanced HEP    Time  8    Period  Weeks    Status  New    Target Date  08/19/19      PT LONG TERM GOAL #2   Title  reduce FOTO for wrist to < or = to 36% limitation     Time  8    Period  Weeks    Status  New    Target Date  08/19/19      PT LONG TERM GOAL #3   Title  demonstrate Lt ankle strength to 4-/5 throughout to improve stability when allowed to weightbear with gait    Time  8    Period  Weeks    Status  New    Target Date  08/19/19      PT LONG TERM GOAL #4   Title  demonstrate Lt grip strength to > or  = to 45# to improve ability to hold objects in the Lt UE    Time  8    Period  Weeks    Status  New    Target Date  08/19/19      PT LONG TERM GOAL #5   Title  demonstrate > or = to 4/5 to 4+/5 Lt wrist strength throughout to improve endurance with use for ADLs and self-care    Time  8    Period  Weeks    Status  New    Target Date  08/19/19      Additional Long Term Goals   Additional Long Term Goals  Yes      PT LONG TERM GOAL #6   Title  ambulate with WBAT with cane or walker as prescribed by MD for all distances    Time  8    Period  Weeks    Status  New    Target Date  08/19/19            Plan - 07/31/19 1548    Clinical Impression Statement  Lt grip is improved this week and all LTGs have been met.  Pt is ambulating all distances with a standard cane and WB in CAM boot on the Lt.  Pt has improved control of the Lt ankle with proprioceptive activity.  Pt requires some assistance for control of Baps board.  Pt reports improved use of Lt UE to 20% of normal.  Pt will continue to benefit from skilled PT to improve strength and use of the Lt UE, grip strength, Lt ankle A/ROM, strength and proprioception and gait.    PT Frequency  2x / week    PT Duration  8 weeks    PT Treatment/Interventions  ADLs/Self Care Home Management;Cryotherapy;Electrical Stimulation;Aquatic Therapy;Moist Heat;Traction;Ultrasound;Neuromuscular re-education;Therapeutic exercise;Therapeutic activities;Gait training;Stair training;Functional mobility training;Patient/family education;Manual techniques;Passive range of motion;Scar  mobilization;Taping;Vasopneumatic Device    PT Next Visit Plan  Conitnue with Lt ankle AROM specifically inversion and eversion. Lt wrist strength and gait with cane in boot.    PT Home Exercise Plan  Access Code: [email or web address]    Recommended Other Services  initial cert is signed    Consulted and Agree with Plan of Care  Patient       Patient will benefit from skilled therapeutic intervention in order to improve the following deficits and impairments:  Decreased activity tolerance, Decreased balance, Decreased mobility, Decreased strength, Impaired flexibility, Decreased scar mobility, Pain, Impaired UE functional use, Decreased endurance, Abnormal gait, Decreased range of motion, Difficulty walking  Visit Diagnosis: Other abnormalities of gait and mobility  Pain in left wrist  Muscle weakness (generalized)  Pain in left lower leg     Problem List Patient Active Problem List   Diagnosis Date Noted  . Fracture of tibial shaft, closed 06/07/2019  . Closed left pilon fracture, initial encounter 06/07/2019  . Fall 06/07/2019  . Closed fracture of left distal radius 06/04/2019    Sigurd Sos, PT 07/31/19 4:19 PM  [facility] [address], [place], [place], [address] Phone: [phone]   Fax:  [phone]  Name: Regina Goodman MRN: [id number] Date of Birth: Apr 02, 1965

## 2019-08-05 ENCOUNTER — Other Ambulatory Visit: Payer: Self-pay

## 2019-08-05 ENCOUNTER — Ambulatory Visit: Payer: BC Managed Care – PPO

## 2019-08-05 DIAGNOSIS — M79662 Pain in left lower leg: Secondary | ICD-10-CM

## 2019-08-05 DIAGNOSIS — M6281 Muscle weakness (generalized): Secondary | ICD-10-CM

## 2019-08-05 DIAGNOSIS — R2689 Other abnormalities of gait and mobility: Secondary | ICD-10-CM | POA: Diagnosis not present

## 2019-08-05 DIAGNOSIS — M25532 Pain in left wrist: Secondary | ICD-10-CM

## 2019-08-05 NOTE — Therapy (Signed)
[facility] [address], [address] [place], [place], [address] Phone: [phone]   Fax:  [phone]  Physical Therapy Treatment  Patient Details  Name: Regina Goodman MRN: [id number] Date of Birth: 1965-06-14 Referring Provider (PT): Truitt Merle, MD   Encounter Date: 08/05/2019  PT End of Session - 08/05/19 1445    Visit Number  12    Date for PT Re-Evaluation  08/19/19    Authorization Type  [employer]    PT Start Time  1358    PT Stop Time  1458    PT Time Calculation (min)  60 min    Activity Tolerance  Patient tolerated treatment well    Behavior During Therapy  [facility] for tasks assessed/performed       History reviewed. No pertinent past medical history.  Past Surgical History:  Procedure Laterality Date  . EXTERNAL FIXATION LEG Left 06/04/2019   Procedure: EXTERNAL FIXATION LEG;  Surgeon: Bjorn Pippin, MD;  [facility]: [facility];  Service: Orthopedics;  Laterality: Left;  . OPEN REDUCTION INTERNAL FIXATION (ORIF) TIBIA/FIBULA FRACTURE Left 06/06/2019   Procedure: OPEN REDUCTION INTERNAL FIXATION (ORIF) TIBIA/FIBULA FRACTURE;  Surgeon: Roby Lofts, MD;  Location[facility];  Service: Orthopedics;  Laterality: Left;  . ORIF WRIST FRACTURE Left 06/04/2019  . ORIF WRIST FRACTURE Left 06/04/2019   Procedure: OPEN REDUCTION INTERNAL FIXATION (ORIF) WRIST FRACTURE;  Surgeon: Bjorn Pippin, MD;  [facility];  Service: Orthopedics;  Laterality: Left;    There were no vitals filed for this visit.  Subjective Assessment - 08/05/19 1356    Subjective  I was on my foot a little bit this morning so it is a little more swollen.    Currently in Pain?  No/denies         [facility] PT Assessment - 08/05/19 0001      Strength   Strength Assessment Site  Wrist    Right/Left Wrist  Left    Left Wrist Flexion  4/5    Left Wrist Extension  4/5    Left Wrist Radial Deviation  4-/5    Left Hand Grip (lbs)  31                    [facility] Adult PT Treatment/Exercise - 08/05/19 0001      Knee/Hip Exercises: Aerobic   Nustep  L3 x 10 min    PT present to discuss status     Hand Exercises   Digiticizer  mass grip redx 2 minutes, individual fingers x 2 minutes       Wrist Exercises   Other wrist exercises  Thick velcro strip 10x supination, wrist flex/ect   verbal cues to reduce scapular elevation     Vasopneumatic   Number Minutes Vasopneumatic   10 minutes    Vasopnuematic Location   Ankle    Vasopneumatic Pressure  Medium    Vasopneumatic Temperature   3 flakes      Manual Therapy   Manual Therapy  Soft tissue mobilization    Passive ROM  Lt ankle in all directions to tolerance, Lt wrist into flexion, extension, supination and pronation      Ankle Exercises: Seated   BAPS  Sitting;Level 2   x 1 minute- 4 directions              PT Short Term Goals - 07/29/19 1533      PT SHORT TERM GOAL #2   Title  demonstrate Lt grip strength to > or = to 25# to improve ability to hold objects in the Lt hand    Time  4    Period  Weeks    Status  Achieved   27#   Target Date  07/22/19      PT SHORT TERM GOAL #3   Title  demonstrate gait with PWB as prescribed by MD with use of walker    Time  4    Period  Weeks    Status  Achieved        PT Long Term Goals - 06/24/19 1520      PT LONG TERM GOAL #1   Title  be independent in advanced HEP    Time  8    Period  Weeks    Status  New    Target Date  08/19/19      PT LONG TERM GOAL #2   Title  reduce FOTO for wrist to < or = to 36% limitation    Time  8    Period  Weeks    Status  New    Target Date  08/19/19      PT LONG TERM GOAL #3   Title  demonstrate Lt ankle strength to 4-/5 throughout to improve stability when allowed to weightbear with gait    Time  8    Period  Weeks    Status  New    Target Date  08/19/19      PT LONG TERM GOAL #4   Title  demonstrate Lt grip strength to > or  = to 45# to improve  ability to hold objects in the Lt UE    Time  8    Period  Weeks    Status  New    Target Date  08/19/19      PT LONG TERM GOAL #5   Title  demonstrate > or = to 4/5 to 4+/5 Lt wrist strength throughout to improve endurance with use for ADLs and self-care    Time  8    Period  Weeks    Status  New    Target Date  08/19/19      Additional Long Term Goals   Additional Long Term Goals  Yes      PT LONG TERM GOAL #6   Title  ambulate with WBAT with cane or walker as prescribed by MD for all distances    Time  8    Period  Weeks    Status  New    Target Date  08/19/19            Plan - 08/05/19 1417    Clinical Impression Statement  Lt grip improved again this week. Lt wrist strength is functional for light tasks and pt is continuing to improve this strength for higher level and endurance tasks.  Pt continues to work on flexibility of the Lt forearm and wrist at home.   Pt is ambulating all distances with a standard cane and WB in CAM boot on the Lt.  Pt has improved control of the Lt ankle with proprioceptive activity. Pt requires some assistance for control of Baps board.  Pt reports improved use of Lt UE to 25-30% of normal.  Pt will continue to benefit from skilled PT to improve strength and use of the Lt UE, grip strength, Lt ankle A/ROM, strength and proprioception and gait.    PT Frequency  2x / week  PT Duration  8 weeks    PT Treatment/Interventions  ADLs/Self Care Home Management;Cryotherapy;Electrical Stimulation;Aquatic Therapy;Moist Heat;Traction;Ultrasound;Neuromuscular re-education;Therapeutic exercise;Therapeutic activities;Gait training;Stair training;Functional mobility training;Patient/family education;Manual techniques;Passive range of motion;Scar mobilization;Taping;Vasopneumatic Device    PT Next Visit Plan  Conitnue with Lt ankle AROM/PROM specifically inversion and eversion. Ankle strength and proprioception.  Manual to improve Lt supination. Lt wrist strength  and gait with cane in boot.    PT Home Exercise Plan  Access Code: [email or web address]    Consulted and Agree with Plan of Care  Patient       Patient will benefit from skilled therapeutic intervention in order to improve the following deficits and impairments:  Decreased activity tolerance, Decreased balance, Decreased mobility, Decreased strength, Impaired flexibility, Decreased scar mobility, Pain, Impaired UE functional use, Decreased endurance, Abnormal gait, Decreased range of motion, Difficulty walking  Visit Diagnosis: Pain in left wrist  Other abnormalities of gait and mobility  Muscle weakness (generalized)  Pain in left lower leg     Problem List Patient Active Problem List   Diagnosis Date Noted  . Fracture of tibial shaft, closed 06/07/2019  . Closed left pilon fracture, initial encounter 06/07/2019  . Fall 06/07/2019  . Closed fracture of left distal radius 06/04/2019     Lorrene Reid, PT 08/05/19 2:47 PM  [facility] [address], [address] [place], [place], [address] Phone: [phone]   Fax:  [phone]  Name: Regina Goodman MRN: [id number] Date of Birth: [birth year]

## 2019-08-14 ENCOUNTER — Other Ambulatory Visit: Payer: Self-pay

## 2019-08-14 ENCOUNTER — Ambulatory Visit: Payer: BC Managed Care – PPO | Admitting: Physical Therapy

## 2019-08-14 ENCOUNTER — Encounter: Payer: Self-pay | Admitting: Physical Therapy

## 2019-08-14 DIAGNOSIS — M79662 Pain in left lower leg: Secondary | ICD-10-CM

## 2019-08-14 DIAGNOSIS — M6281 Muscle weakness (generalized): Secondary | ICD-10-CM

## 2019-08-14 DIAGNOSIS — R2689 Other abnormalities of gait and mobility: Secondary | ICD-10-CM

## 2019-08-14 DIAGNOSIS — M25532 Pain in left wrist: Secondary | ICD-10-CM

## 2019-08-14 NOTE — Therapy (Signed)
[facility] [address] [place], [place], [address] Phone: [phone]   Fax:  [phone]  Physical Therapy Treatment  Patient Details  Name: Regina Goodman MRN: [id number] Date of Birth: Aug 17, 1965 Referring Provider (PT): Katha Hamming, MD   Encounter Date: 08/14/2019  PT End of Session - 08/14/19 1228    Visit Number  13    Date for PT Re-Evaluation  08/19/19    Authorization Type  [employer]    PT Start Time  1228    PT Stop Time  1311    PT Time Calculation (min)  43 min    Activity Tolerance  Patient tolerated treatment well    Behavior During Therapy  [facility] for tasks assessed/performed       History reviewed. No pertinent past medical history.  Past Surgical History:  Procedure Laterality Date  . EXTERNAL FIXATION LEG Left 06/04/2019   Procedure: EXTERNAL FIXATION LEG;  Surgeon: Hiram Gash, MD;  Location: [place];  Service: Orthopedics;  Laterality: Left;  . OPEN REDUCTION INTERNAL FIXATION (ORIF) TIBIA/FIBULA FRACTURE Left 06/06/2019   Procedure: OPEN REDUCTION INTERNAL FIXATION (ORIF) TIBIA/FIBULA FRACTURE;  Surgeon: Shona Needles, MD;  Location: [place];  Service: Orthopedics;  Laterality: Left;  . ORIF WRIST FRACTURE Left 06/04/2019  . ORIF WRIST FRACTURE Left 06/04/2019   Procedure: OPEN REDUCTION INTERNAL FIXATION (ORIF) WRIST FRACTURE;  Surgeon: Hiram Gash, MD;  Location: [place];  Service: Orthopedics;  Laterality: Left;    There were no vitals filed for this visit.  Subjective Assessment - 08/14/19 1230    Subjective  I see Dr Griffin Basil tomorrow.    Pertinent History  ORIF Lt radius (06/04/19), ORIF Lt tibia (05/1119)  MD orders: NWB Lt LE, allow for active and passive ROM ankle, strengthening Lt LE, likely advance WB in 4 weeks    Currently in Pain?  No/denies         [facility] PT Assessment - 08/14/19 0001      AROM   Right/Left Wrist  Left    Left Wrist Extension  50 Degrees    Supination 75 degrees active   Left Wrist Flexion  55 Degrees      Strength   Strength Assessment Site  Wrist    Right/Left Wrist  Left    Left Wrist Flexion  4/5    Left Wrist Extension  4/5    Left Wrist Radial Deviation  4-/5    Left Hand Grip (lbs)  30                    [facility] Adult PT Treatment/Exercise - 08/14/19 0001      Hand Exercises   Digiticizer  mass grip redx 2 minutes, individual fingers x 2 minutes       Vasopneumatic   Number Minutes Vasopneumatic   10 minutes    Vasopnuematic Location   Ankle    Vasopneumatic Pressure  Medium    Vasopneumatic Temperature   3 flakes      Manual Therapy   Manual Therapy  Soft tissue mobilization    Passive ROM  Lt elbow, wrist  and forearm.                PT Short Term Goals - 07/29/19 1533      PT SHORT TERM GOAL #2   Title  demonstrate Lt grip strength to > or = to 25# to improve ability to hold objects in  the Lt hand    Time  4    Period  Weeks    Status  Achieved   27#   Target Date  07/22/19      PT SHORT TERM GOAL #3   Title  demonstrate gait with PWB as prescribed by MD with use of walker    Time  4    Period  Weeks    Status  Achieved        PT Long Term Goals - 08/14/19 1306      PT LONG TERM GOAL #4   Title  demonstrate Lt grip strength to > or  = to 45# to improve ability to hold objects in the Lt UE    Time  8    Period  Weeks    Status  On-going   30#           Plan - 08/14/19 1229    Clinical Impression Statement  Lt grip continues to improve. Lt wrist strength is functional for light tasks like brushing her teeth and squeezing shampoo out of the bottle. Measurements taken for ROM/MMT; see note    Personal Factors and Comorbidities  Comorbidity 1    Comorbidities  fall and ORIF    Examination-Activity Limitations  Dressing;Locomotion Level;Stand    Examination-Participation Restrictions  Shop;Meal Prep    Stability/Clinical Decision Making  Evolving/Moderate  complexity    Rehab Potential  Excellent    PT Frequency  2x / week    PT Duration  8 weeks    PT Treatment/Interventions  ADLs/Self Care Home Management;Cryotherapy;Electrical Stimulation;Aquatic Therapy;Moist Heat;Traction;Ultrasound;Neuromuscular re-education;Therapeutic exercise;Therapeutic activities;Gait training;Stair training;Functional mobility training;Patient/family education;Manual techniques;Passive range of motion;Scar mobilization;Taping;Vasopneumatic Device    PT Next Visit Plan  Send note to MD    PT Home Exercise Plan  Access Code: [email or web address]    Consulted and Agree with Plan of Care  Patient       Patient will benefit from skilled therapeutic intervention in order to improve the following deficits and impairments:  Decreased activity tolerance, Decreased balance, Decreased mobility, Decreased strength, Impaired flexibility, Decreased scar mobility, Pain, Impaired UE functional use, Decreased endurance, Abnormal gait, Decreased range of motion, Difficulty walking  Visit Diagnosis: Pain in left wrist  Other abnormalities of gait and mobility  Muscle weakness (generalized)  Pain in left lower leg     Problem List Patient Active Problem List   Diagnosis Date Noted  . Fracture of tibial shaft, closed 06/07/2019  . Closed left pilon fracture, initial encounter 06/07/2019  . Fall 06/07/2019  . Closed fracture of left distal radius 06/04/2019    Traquan Duarte, PTA 08/14/2019, 1:17 PM  [facility] [address], [address] [place], [place], [address] Phone: [phone]   Fax:  [phone]  Name: Carilyn Woolston MRN: [id number] Date of Birth: 1965-05-10

## 2019-08-16 ENCOUNTER — Encounter: Payer: Self-pay | Admitting: Physical Therapy

## 2019-08-16 ENCOUNTER — Other Ambulatory Visit: Payer: Self-pay

## 2019-08-16 ENCOUNTER — Ambulatory Visit: Payer: BC Managed Care – PPO | Admitting: Physical Therapy

## 2019-08-16 DIAGNOSIS — M25532 Pain in left wrist: Secondary | ICD-10-CM

## 2019-08-16 DIAGNOSIS — R2689 Other abnormalities of gait and mobility: Secondary | ICD-10-CM

## 2019-08-16 DIAGNOSIS — M6281 Muscle weakness (generalized): Secondary | ICD-10-CM

## 2019-08-16 DIAGNOSIS — M79662 Pain in left lower leg: Secondary | ICD-10-CM

## 2019-08-16 NOTE — Therapy (Signed)
[facility] [address] [place], [place], [address] Phone: [phone]   Fax:  [phone]  Physical Therapy Treatment  Patient Details  Name: Regina Goodman MRN: [id number] Date of Birth: [birth year] Referring Provider (PT): Truitt Merle, MD   Encounter Date: 08/16/2019  PT End of Session - 08/16/19 0801    Visit Number  14    Date for PT Re-Evaluation  08/19/19    Authorization Type  [employer]    PT Start Time  [phone]    PT Stop Time  0900    PT Time Calculation (min)  64 min    Activity Tolerance  Patient tolerated treatment well    Behavior During Therapy  [facility] for tasks assessed/performed       History reviewed. No pertinent past medical history.  Past Surgical History:  Procedure Laterality Date  . EXTERNAL FIXATION LEG Left 06/04/2019   Procedure: EXTERNAL FIXATION LEG;  Surgeon: Bjorn Pippin, MD;  [facility];  Service: Orthopedics;  Laterality: Left;  . OPEN REDUCTION INTERNAL FIXATION (ORIF) TIBIA/FIBULA FRACTURE Left 06/06/2019   Procedure: OPEN REDUCTION INTERNAL FIXATION (ORIF) TIBIA/FIBULA FRACTURE;  Surgeon: Roby Lofts, MD;  [facility];  Service: Orthopedics;  Laterality: Left;  . ORIF WRIST FRACTURE Left 06/04/2019  . ORIF WRIST FRACTURE Left 06/04/2019   Procedure: OPEN REDUCTION INTERNAL FIXATION (ORIF) WRIST FRACTURE;  Surgeon: Bjorn Pippin, MD;  [facility];  Service: Orthopedics;  Laterality: Left;    There were no vitals filed for this visit.  Subjective Assessment - 08/16/19 0802    Subjective  MD super pleased with wrist, pt will not have to return to Dr Everardo Pacific. MD reports aggressive ROM ok and pt should get 90% of her ROM back. Sees ankle MD on Tuesday.    Pertinent History  ORIF Lt radius (06/04/19), ORIF Lt tibia (05/1119)  MD orders: NWB Lt LE, allow for active and passive ROM ankle, strengthening Lt LE, likely advance WB in 4 weeks    Currently in Pain?   No/denies         [facility] PT Assessment - 08/16/19 0001      AROM   Right/Left Wrist  Left      PROM   Left Ankle Dorsiflexion  17    Left Ankle Plantar Flexion  43    Left Ankle Inversion  35    Left Ankle Eversion  25                    [facility] Adult PT Treatment/Exercise - 08/16/19 0001      Knee/Hip Exercises: Aerobic   Nustep  L4 x 10 min with disucssion of MD       Wrist Exercises   Wrist Flexion  Strengthening;Left;20 reps;Supine;Bar weights/barbell    Bar Weights/Barbell (Wrist Flexion)  1 lb    Wrist Extension  Strengthening;Left;20 reps;Supine;Bar weights/barbell    Bar Weights/Barbell (Wrist Extension)  1 lb      Vasopneumatic   Number Minutes Vasopneumatic   10 minutes    Vasopnuematic Location   Ankle    Vasopneumatic Pressure  Medium    Vasopneumatic Temperature   3 flakes      Manual Therapy   Passive ROM  Lt elbow, wrist, ankle  and forearm.       Ankle Exercises: Supine   T-Band  green band 20x in each direction. PTA stabilizing tibia, VC to watch subs with toes for  Inv/Ev      Ankle Exercises: Seated   Towel Inversion/Eversion  Other (comment)   20x eversion              PT Short Term Goals - 07/29/19 1533      PT SHORT TERM GOAL #2   Title  demonstrate Lt grip strength to > or = to 25# to improve ability to hold objects in the Lt hand    Time  4    Period  Weeks    Status  Achieved   27#   Target Date  07/22/19      PT SHORT TERM GOAL #3   Title  demonstrate gait with PWB as prescribed by MD with use of walker    Time  4    Period  Weeks    Status  Achieved        PT Long Term Goals - 08/14/19 1306      PT LONG TERM GOAL #4   Title  demonstrate Lt grip strength to > or  = to 45# to improve ability to hold objects in the Lt UE    Time  8    Period  Weeks    Status  On-going   30#           Plan - 08/16/19 0801    Clinical Impression Statement  Pt was discharged from Dr Griffin Basil. Ok to proceed with  aggressive wrist ROM, pt should anticipate 90% retun of normal ROM per MD. Pt will see Dr Doreatha Martin Tuesday . Ankle measurements taken, see note. Pt remains in CAM boot for all weightbearing. No issues with pain.    Personal Factors and Comorbidities  Comorbidity 1    Comorbidities  fall and ORIF    Examination-Activity Limitations  Dressing;Locomotion Level;Stand    Examination-Participation Restrictions  Shop;Meal Prep    Stability/Clinical Decision Making  Evolving/Moderate complexity    Rehab Potential  Excellent    PT Frequency  2x / week    PT Duration  8 weeks    PT Treatment/Interventions  ADLs/Self Care Home Management;Cryotherapy;Electrical Stimulation;Aquatic Therapy;Moist Heat;Traction;Ultrasound;Neuromuscular re-education;Therapeutic exercise;Therapeutic activities;Gait training;Stair training;Functional mobility training;Patient/family education;Manual techniques;Passive range of motion;Scar mobilization;Taping;Vasopneumatic Device    PT Next Visit Plan  See what MD says, check use of CAM??    PT Home Exercise Plan  Access Code: [email or web address]    Consulted and Agree with Plan of Care  Patient       Patient will benefit from skilled therapeutic intervention in order to improve the following deficits and impairments:  Decreased activity tolerance, Decreased balance, Decreased mobility, Decreased strength, Impaired flexibility, Decreased scar mobility, Pain, Impaired UE functional use, Decreased endurance, Abnormal gait, Decreased range of motion, Difficulty walking  Visit Diagnosis: Pain in left wrist  Other abnormalities of gait and mobility  Muscle weakness (generalized)  Pain in left lower leg     Problem List Patient Active Problem List   Diagnosis Date Noted  . Fracture of tibial shaft, closed 06/07/2019  . Closed left pilon fracture, initial encounter 06/07/2019  . Fall 06/07/2019  . Closed fracture of left distal radius 06/04/2019    Karol Liendo, PTA 08/16/2019,  9:39 AM  [facility] [address] [place], [place], [address] Phone: [phone]   Fax:  [phone]  Name: Regina Goodman MRN: [id number] Date of Birth: [birth year]

## 2019-08-19 ENCOUNTER — Encounter: Payer: BC Managed Care – PPO | Admitting: Physical Therapy

## 2019-08-21 ENCOUNTER — Ambulatory Visit: Payer: BC Managed Care – PPO | Admitting: Physical Therapy

## 2019-08-21 ENCOUNTER — Other Ambulatory Visit: Payer: Self-pay

## 2019-08-21 ENCOUNTER — Encounter: Payer: Self-pay | Admitting: Physical Therapy

## 2019-08-21 DIAGNOSIS — M25532 Pain in left wrist: Secondary | ICD-10-CM

## 2019-08-21 DIAGNOSIS — R2689 Other abnormalities of gait and mobility: Secondary | ICD-10-CM

## 2019-08-21 DIAGNOSIS — M79662 Pain in left lower leg: Secondary | ICD-10-CM

## 2019-08-21 DIAGNOSIS — M6281 Muscle weakness (generalized): Secondary | ICD-10-CM

## 2019-08-21 NOTE — Therapy (Signed)
[facility] [address] [place], [place], [address] Phone: [phone]   Fax:  [phone]  Physical Therapy Treatment  Patient Details  Name: Regina Goodman MRN: [id number] Date of Birth: 28-Oct-1965 Referring Provider (PT): Katha Hamming, MD   Encounter Date: 08/21/2019  PT End of Session - 08/21/19 1145    Visit Number  15    Date for PT Re-Evaluation  08/19/19    Authorization Type  [place]    PT Start Time  1140    PT Stop Time  1230    PT Time Calculation (min)  50 min    Activity Tolerance  Patient tolerated treatment well    Behavior During Therapy  [facility] for tasks assessed/performed       History reviewed. No pertinent past medical history.  Past Surgical History:  Procedure Laterality Date  . EXTERNAL FIXATION LEG Left 06/04/2019   Procedure: EXTERNAL FIXATION LEG;  Surgeon: Hiram Gash, MD;  Location: [place];  Service: Orthopedics;  Laterality: Left;  . OPEN REDUCTION INTERNAL FIXATION (ORIF) TIBIA/FIBULA FRACTURE Left 06/06/2019   Procedure: OPEN REDUCTION INTERNAL FIXATION (ORIF) TIBIA/FIBULA FRACTURE;  Surgeon: Shona Needles, MD;  Location: [place];  Service: Orthopedics;  Laterality: Left;  . ORIF WRIST FRACTURE Left 06/04/2019  . ORIF WRIST FRACTURE Left 06/04/2019   Procedure: OPEN REDUCTION INTERNAL FIXATION (ORIF) WRIST FRACTURE;  Surgeon: Hiram Gash, MD;  Location: [place];  Service: Orthopedics;  Laterality: Left;    There were no vitals filed for this visit.  Subjective Assessment - 08/21/19 1148    Subjective  MD pleased. Pt is now out of CAM boot with no restrictions. Continue to use cane to normalize gait. No pain. Decreased ankle motion when ambulating, mainly with heel strike and push off.    Pertinent History  ORIF Lt radius (06/04/19), ORIF Lt tibia (05/1119)  MD orders: NWB Lt LE, allow for active and passive ROM ankle, strengthening Lt LE, likely advance WB in 4 weeks    Currently in Pain?  No/denies         [facility] PT Assessment - 08/21/19 0001      AROM   Left Wrist Extension  50 Degrees   Supination 75 degrees active   Left Wrist Flexion  55 Degrees      PROM   Left Ankle Dorsiflexion  17    Left Ankle Plantar Flexion  43    Left Ankle Inversion  35    Left Ankle Eversion  25      Strength   Strength Assessment Site  Ankle    Right/Left Wrist  Left    Left Ankle Dorsiflexion  4/5    Left Ankle Plantar Flexion  3+/5   Pt cannot do single leg heel lift but can do 20x with both    Left Ankle Inversion  4/5    Left Ankle Eversion  4-/5                    [facility] Adult PT Treatment/Exercise - 08/21/19 0001      Knee/Hip Exercises: Machines for Strengthening   Total Gym Leg Press  Calf press Bil 35# 10x    Seat 5     Manual Therapy   Soft tissue mobilization  achilles    Passive ROM  Lt ankle DF/PF      Ankle Exercises: Seated   BAPS  Sitting;Level 3   20x each  Ankle Exercises: Stretches   Gastroc Stretch  3 reps;20 seconds   added to HEP   Other Stretch  DF stretch on 2nd step 10x, then  hold at end range 10 sec 5x             PT Education - 08/21/19 1151    Education Details  Updated HEP    Person(s) Educated  Patient    Methods  Explanation;Verbal cues;Handout    Comprehension  Verbalized understanding;Returned demonstration       PT Short Term Goals - 08/21/19 1149      PT SHORT TERM GOAL #1   Title  be independent in initial HEP    Time  4    Period  Weeks    Status  Achieved    Target Date  07/22/19      PT SHORT TERM GOAL #2   Title  demonstrate Lt grip strength to > or = to 25# to improve ability to hold objects in the Lt hand    Time  4    Period  Weeks    Status  Achieved    Target Date  07/22/19      PT SHORT TERM GOAL #3   Title  demonstrate gait with PWB as prescribed by MD with use of walker    Period  Weeks    Status  Achieved    Target Date  07/22/19        PT Long Term  Goals - 08/21/19 1149      PT LONG TERM GOAL #1   Title  be independent in advanced HEP    Time  8    Period  Weeks    Status  On-going   Still progressing as pt just got out of boot     PT LONG TERM GOAL #3   Title  demonstrate Lt ankle strength to 4-/5 throughout to improve stability when allowed to weightbear with gait    Time  8    Period  Weeks    Status  Partially Met      PT LONG TERM GOAL #6   Title  ambulate with WBAT with cane or walker as prescribed by MD for all distances    Time  8    Period  Weeks    Status  On-going   Pt has not tried long distances yet but has no troubles with short distances.           Plan - 08/21/19 1145    Clinical Impression Statement  Pt saw MD for LT ankle fracture. Pt is now out of her boot and reports no restrictions. Ankle demonstrates tightness with DF/PF as she walks and weakness in PF. HEP was updated to accomodate for her new status. MD wants pt to continue with PT until her next visit on July 13th. Pt making progress towards all goals.    Personal Factors and Comorbidities  Comorbidity 1    Comorbidities  fall and ORIF    Examination-Activity Limitations  Dressing;Locomotion Level;Stand    Examination-Participation Restrictions  Shop;Meal Prep    Stability/Clinical Decision Making  Evolving/Moderate complexity    Rehab Potential  Excellent    PT Frequency  2x / week    PT Duration  8 weeks    PT Treatment/Interventions  ADLs/Self Care Home Management;Cryotherapy;Electrical Stimulation;Aquatic Therapy;Moist Heat;Traction;Ultrasound;Neuromuscular re-education;Therapeutic exercise;Therapeutic activities;Gait training;Stair training;Functional mobility training;Patient/family education;Manual techniques;Passive range of motion;Scar mobilization;Taping;Vasopneumatic Device    PT Next Visit Plan  Pt  and MD would like pt to continue with PT till next MD on JULY 13th. Renew for 2x week through 10/08/19/    PT Home Exercise Plan  Access  Code: [email or web address]    Consulted and Agree with Plan of Care  Patient       Patient will benefit from skilled therapeutic intervention in order to improve the following deficits and impairments:  Decreased activity tolerance, Decreased balance, Decreased mobility, Decreased strength, Impaired flexibility, Decreased scar mobility, Pain, Impaired UE functional use, Decreased endurance, Abnormal gait, Decreased range of motion, Difficulty walking  Visit Diagnosis: Pain in left wrist  Other abnormalities of gait and mobility  Muscle weakness (generalized)  Pain in left lower leg     Problem List Patient Active Problem List   Diagnosis Date Noted  . Fracture of tibial shaft, closed 06/07/2019  . Closed left pilon fracture, initial encounter 06/07/2019  . Fall 06/07/2019  . Closed fracture of left distal radius 06/04/2019   Myrene Galas, PTA 08/21/19 1:50 PM   [facility] [address], [place], [place], [address] Phone: [phone]   Fax:  [phone]  Name: Regina Goodman MRN: [id number] Date of Birth: Oct 23, 1965  Access Code: [email or web address]: [email or web address]: 04/16/2021Prepared by: Anderson Malta CochranExercises  Standing Wrist Flexion Stretch - 3 x daily - 7 x weekly - 3 reps - 1 sets - 20 hold  Standing Wrist Extension Stretch - 3 x daily - 7 x weekly - 3 reps - 1 sets - 20 hold  Wrist Extension AROM - 3 x daily - 7 x weekly - 2 sets - 10 reps  Wrist Flexion AROM - 3 x daily - 7 x weekly - 2 sets - 10 reps  Wrist Radial Deviation AROM - 3 x daily - 7 x weekly - 2 sets - 10 reps  Seated Forearm Pronation and Supination AROM - 3 x daily - 7 x weekly - 2 sets - 10 reps  Supine Single Leg Ankle Pumps - 3 x daily - 7 x weekly - 3 sets - 10 reps  Supine Ankle Inversion AROM - 3 x daily - 7 x weekly - 3 sets - 10 reps  Supine Ankle Eversion AROM - 3 x daily - 7 x weekly - 3 sets - 10 reps  Seated  Ankle Alphabet - 1 x daily - 7 x weekly - 10 reps - 3 sets  Seated Ankle Alphabet - 7 x weekly  Sidelying Hip Abduction - 3 x daily - 7 x weekly - 2 sets - 10 reps  Long Sitting Ankle Plantar Flexion with Resistance - 2 x daily - 7 x weekly - 2 sets - 15 reps  Long Sitting Ankle Dorsiflexion with Anchored Resistance - 2 x daily - 7 x weekly - 2 sets - 15 reps  Standing Heel Raise - 2 x daily - 7 x weekly - 2 sets - 10 reps  Single Leg Stance - 3 x daily - 7 x weekly - 3 sets - 15 hold  Standing Gastroc Stretch - 3 x daily - 7 x weekly - 1 sets - 10 reps - 30 hold  Standing Ankle Dorsiflexion Stretch on Chair - 2 x daily - 7 x weekly - 2 sets - 20 reps - 10sec hold

## 2019-08-27 NOTE — Addendum Note (Signed)
Addended by: Edrick Oh on: 08/27/2019 08:24 AM   Modules accepted: Orders

## 2019-08-27 NOTE — Addendum Note (Signed)
Addended by: Edrick Oh on: 08/27/2019 07:54 AM   Modules accepted: Orders

## 2019-08-28 ENCOUNTER — Other Ambulatory Visit: Payer: Self-pay

## 2019-08-28 ENCOUNTER — Ambulatory Visit: Payer: BC Managed Care – PPO | Attending: Student

## 2019-08-28 DIAGNOSIS — R2689 Other abnormalities of gait and mobility: Secondary | ICD-10-CM | POA: Diagnosis present

## 2019-08-28 DIAGNOSIS — M6281 Muscle weakness (generalized): Secondary | ICD-10-CM | POA: Insufficient documentation

## 2019-08-28 DIAGNOSIS — M79662 Pain in left lower leg: Secondary | ICD-10-CM | POA: Diagnosis present

## 2019-08-28 DIAGNOSIS — M25532 Pain in left wrist: Secondary | ICD-10-CM | POA: Insufficient documentation

## 2019-08-28 NOTE — Therapy (Signed)
[facility] [address] [place], [place], [address] Phone: [phone]   Fax:  [phone]  Physical Therapy Treatment  Patient Details  Name: Regina Goodman MRN: [id number] Date of Birth: 1965-05-12 Referring Provider (PT): Truitt Merle, MD   Encounter Date: 08/28/2019  PT End of Session - 08/28/19 1147    Visit Number  16    Date for PT Re-Evaluation  10/08/19    Authorization Type  [employer]    PT Start Time  1100    PT Stop Time  1144    PT Time Calculation (min)  44 min    Activity Tolerance  Patient tolerated treatment well    Behavior During Therapy  [facility] for tasks assessed/performed       History reviewed. No pertinent past medical history.  Past Surgical History:  Procedure Laterality Date  . EXTERNAL FIXATION LEG Left 06/04/2019   Procedure: EXTERNAL FIXATION LEG;  Surgeon: Bjorn Pippin, MD;  [facility];  Service: Orthopedics;  Laterality: Left;  . OPEN REDUCTION INTERNAL FIXATION (ORIF) TIBIA/FIBULA FRACTURE Left 06/06/2019   Procedure: OPEN REDUCTION INTERNAL FIXATION (ORIF) TIBIA/FIBULA FRACTURE;  Surgeon: Roby Lofts, MD;  [facility];  Service: Orthopedics;  Laterality: Left;  . ORIF WRIST FRACTURE Left 06/04/2019  . ORIF WRIST FRACTURE Left 06/04/2019   Procedure: OPEN REDUCTION INTERNAL FIXATION (ORIF) WRIST FRACTURE;  Surgeon: Bjorn Pippin, MD;  [facility];  Service: Orthopedics;  Laterality: Left;    There were no vitals filed for this visit.  Subjective Assessment - 08/28/19 1113    Subjective  I'm doing good with wearing shoes.    Currently in Pain?  No/denies         [facility] PT Assessment - 08/28/19 0001      Strength   Left Hand Grip (lbs)  30                    [facility] Adult PT Treatment/Exercise - 08/28/19 0001      Knee/Hip Exercises: Aerobic   Nustep  L4 x 10 min- PT present for goal assessment.       Knee/Hip Exercises: Standing   Heel Raises  Both;2 sets;10 reps    Heel Raises Limitations  verbal cues for alignment    Rocker Board  3 minutes    SLS  15 second hold x 3 on Lt     SLS with Vectors  some UE support required     Rebounder  weight shifting 3 ways x 1 minute each    Walking with Sports Cord  15 # forward and reverse x 10 each    Other Standing Knee Exercises  alternating step taps on edge of treadmill while standing on black balance pad 2x10 each      Hand Exercises   Digiticizer  mass grip: green x 3 minutes               PT Short Term Goals - 08/21/19 1149      PT SHORT TERM GOAL #1   Title  be independent in initial HEP    Time  4    Period  Weeks    Status  Achieved    Target Date  07/22/19      PT SHORT TERM GOAL #2   Title  demonstrate Lt grip strength to > or = to 25# to improve ability to hold objects in the Lt hand  Time  4    Period  Weeks    Status  Achieved    Target Date  07/22/19      PT SHORT TERM GOAL #3   Title  demonstrate gait with PWB as prescribed by MD with use of walker    Period  Weeks    Status  Achieved    Target Date  07/22/19        PT Long Term Goals - 08/28/19 1114      PT LONG TERM GOAL #6   Title  ambulate with WBAT with cane or walker as prescribed by MD for all distances    Status  Achieved            Plan - 08/28/19 1127    Clinical Impression Statement  Pt is doing very well with mobility now that she is out of the the CAM boot.  Session focused on Lt ankle strength, flexibility and propriocepton.  Pt denies any limitations with her wrist at this time and she is continuing with her HEP for further strength and flexibility gains.  Pt demonstrates reduced proprioception on the Lt ankle with more challenging tasks and is able to stabilize with min UE support.  Pt will continue to benefit from skilled PT to address Lt ankle strength, stability and gait in addition to Lt wrist and hand strength progression as needed.    Rehab Potential   Excellent    PT Frequency  2x / week    PT Duration  8 weeks    PT Treatment/Interventions  ADLs/Self Care Home Management;Cryotherapy;Electrical Stimulation;Aquatic Therapy;Moist Heat;Traction;Ultrasound;Neuromuscular re-education;Therapeutic exercise;Therapeutic activities;Gait training;Stair training;Functional mobility training;Patient/family education;Manual techniques;Passive range of motion;Scar mobilization;Taping;Vasopneumatic Device    PT Next Visit Plan  Lt ankle strength, proprioception, gait, Lt wrist strength as needed    PT Home Exercise Plan  Access Code: [email or web address]    Consulted and Agree with Plan of Care  Patient       Patient will benefit from skilled therapeutic intervention in order to improve the following deficits and impairments:  Decreased activity tolerance, Decreased balance, Decreased mobility, Decreased strength, Impaired flexibility, Decreased scar mobility, Pain, Impaired UE functional use, Decreased endurance, Abnormal gait, Decreased range of motion, Difficulty walking  Visit Diagnosis: Pain in left wrist  Other abnormalities of gait and mobility  Muscle weakness (generalized)  Pain in left lower leg     Problem List Patient Active Problem List   Diagnosis Date Noted  . Fracture of tibial shaft, closed 06/07/2019  . Closed left pilon fracture, initial encounter 06/07/2019  . Fall 06/07/2019  . Closed fracture of left distal radius 06/04/2019     Sigurd Sos, PT 08/28/19 11:51 AM  [facility] [address] [place], [place], [address] Phone: [phone]   Fax:  [phone]  Name: Regina Goodman MRN: [id number] Date of Birth: 1965/09/08

## 2019-08-30 ENCOUNTER — Other Ambulatory Visit: Payer: Self-pay

## 2019-08-30 ENCOUNTER — Encounter: Payer: Self-pay | Admitting: Physical Therapy

## 2019-08-30 ENCOUNTER — Ambulatory Visit: Payer: BC Managed Care – PPO | Admitting: Physical Therapy

## 2019-08-30 DIAGNOSIS — R2689 Other abnormalities of gait and mobility: Secondary | ICD-10-CM

## 2019-08-30 DIAGNOSIS — M79662 Pain in left lower leg: Secondary | ICD-10-CM

## 2019-08-30 DIAGNOSIS — M6281 Muscle weakness (generalized): Secondary | ICD-10-CM

## 2019-08-30 DIAGNOSIS — M25532 Pain in left wrist: Secondary | ICD-10-CM | POA: Diagnosis not present

## 2019-08-30 NOTE — Therapy (Signed)
[facility] [address], [place], [place], [address] Phone: [phone]   Fax:  [phone]  Physical Therapy Treatment  Patient Details  Name: Regina Goodman MRN: [id number] Date of Birth: 1965/09/12 Referring Provider (PT): Katha Hamming, MD   Encounter Date: 08/30/2019  PT End of Session - 08/30/19 0932    Visit Number  17    Date for PT Re-Evaluation  10/08/19    Authorization Type  [place]    PT Start Time  0930    PT Stop Time  1009    PT Time Calculation (min)  39 min    Activity Tolerance  Patient tolerated treatment well    Behavior During Therapy  [facility] for tasks assessed/performed       History reviewed. No pertinent past medical history.  Past Surgical History:  Procedure Laterality Date  . EXTERNAL FIXATION LEG Left 06/04/2019   Procedure: EXTERNAL FIXATION LEG;  Surgeon: Hiram Gash, MD;  Location: [place];  Service: Orthopedics;  Laterality: Left;  . OPEN REDUCTION INTERNAL FIXATION (ORIF) TIBIA/FIBULA FRACTURE Left 06/06/2019   Procedure: OPEN REDUCTION INTERNAL FIXATION (ORIF) TIBIA/FIBULA FRACTURE;  Surgeon: Shona Needles, MD;  Location: [place];  Service: Orthopedics;  Laterality: Left;  . ORIF WRIST FRACTURE Left 06/04/2019  . ORIF WRIST FRACTURE Left 06/04/2019   Procedure: OPEN REDUCTION INTERNAL FIXATION (ORIF) WRIST FRACTURE;  Surgeon: Hiram Gash, MD;  Location: [place];  Service: Orthopedics;  Laterality: Left;    There were no vitals filed for this visit.  Subjective Assessment - 08/30/19 0933    Subjective  No complaints from last session. My walking is getting better.    Pertinent History  ORIF Lt radius (06/04/19), ORIF Lt tibia (05/1119)  MD orders: NWB Lt LE, allow for active and passive ROM ankle, strengthening Lt LE, likely advance WB in 4 weeks    Currently in Pain?  No/denies                        [facility] Adult PT Treatment/Exercise - 08/30/19 0001       Knee/Hip Exercises: Aerobic   Elliptical  L1 R1 x 5 min      Knee/Hip Exercises: Machines for Strengthening   Total Gym Leg Press  Bil calf press Seat 5: 35# 2x10, VC to not lock knees, control      Knee/Hip Exercises: Standing   Hip Abduction  --   Green band side stepping 4x, added to HEP   SLS  20 sec on mini tramp 4x.     SLS with Vectors  some UE support required    2 fingers   Rebounder  weight shifting 3 ways x 1 minute each   Side to side with dynamic movement for 4th set   Walking with Sports Cord  15 # forward and reverse x 10 each      Ankle Exercises: Stretches   Other Stretch  DF stretch on 2nd step 10x, then  hold at end range 10 sec 5x             PT Education - 08/30/19 0955    Education Details  HEP    Person(s) Educated  Patient    Methods  Explanation;Demonstration;Verbal cues;Handout    Comprehension  Returned demonstration;Verbalized understanding       PT Short Term Goals - 08/21/19 1149      PT SHORT TERM GOAL #1  Title  be independent in initial HEP    Time  4    Period  Weeks    Status  Achieved    Target Date  07/22/19      PT SHORT TERM GOAL #2   Title  demonstrate Lt grip strength to > or = to 25# to improve ability to hold objects in the Lt hand    Time  4    Period  Weeks    Status  Achieved    Target Date  07/22/19      PT SHORT TERM GOAL #3   Title  demonstrate gait with PWB as prescribed by MD with use of walker    Period  Weeks    Status  Achieved    Target Date  07/22/19        PT Long Term Goals - 08/28/19 1114      PT LONG TERM GOAL #6   Title  ambulate with WBAT with cane or walker as prescribed by MD for all distances    Status  Achieved            Plan - 08/30/19 0940    Clinical Impression Statement  Pt requires verbal cuing for lack of control with ther-ex due to weakness. Pt requires verbal cuing with single leg stance to contract her gluteals and quads more and requires some assistance form 2  fingers. Frequent VC for heel strike vs flat foot walking. Pt given resisted side stepping for HEP to work on her hip strength.    Personal Factors and Comorbidities  Comorbidity 1    Comorbidities  fall and ORIF    Examination-Activity Limitations  Dressing;Locomotion Level;Stand    Examination-Participation Restrictions  Shop;Meal Prep    Stability/Clinical Decision Making  Evolving/Moderate complexity    Rehab Potential  Excellent    PT Frequency  2x / week    PT Duration  8 weeks    PT Treatment/Interventions  ADLs/Self Care Home Management;Cryotherapy;Electrical Stimulation;Aquatic Therapy;Moist Heat;Traction;Ultrasound;Neuromuscular re-education;Therapeutic exercise;Therapeutic activities;Gait training;Stair training;Functional mobility training;Patient/family education;Manual techniques;Passive range of motion;Scar mobilization;Taping;Vasopneumatic Device    PT Next Visit Plan  Lt ankle strength, proprioception, gait, Lt wrist strength as needed    PT Home Exercise Plan  Access Code: [email or web address]    Consulted and Agree with Plan of Care  Patient       Patient will benefit from skilled therapeutic intervention in order to improve the following deficits and impairments:  Decreased activity tolerance, Decreased balance, Decreased mobility, Decreased strength, Impaired flexibility, Decreased scar mobility, Pain, Impaired UE functional use, Decreased endurance, Abnormal gait, Decreased range of motion, Difficulty walking  Visit Diagnosis: Other abnormalities of gait and mobility  Muscle weakness (generalized)  Pain in left lower leg     Problem List Patient Active Problem List   Diagnosis Date Noted  . Fracture of tibial shaft, closed 06/07/2019  . Closed left pilon fracture, initial encounter 06/07/2019  . Fall 06/07/2019  . Closed fracture of left distal radius 06/04/2019    Lee Kuang, PTA 08/30/2019, 10:10 AM  [facility] [address] [place], [place], [address] Phone: [phone]   Fax:  [phone]  Name: Regina Goodman MRN: [id number] Date of Birth: 09/08/1965

## 2019-09-02 ENCOUNTER — Other Ambulatory Visit: Payer: Self-pay

## 2019-09-02 ENCOUNTER — Ambulatory Visit: Payer: BC Managed Care – PPO

## 2019-09-02 DIAGNOSIS — M79662 Pain in left lower leg: Secondary | ICD-10-CM

## 2019-09-02 DIAGNOSIS — M25532 Pain in left wrist: Secondary | ICD-10-CM | POA: Diagnosis not present

## 2019-09-02 DIAGNOSIS — R2689 Other abnormalities of gait and mobility: Secondary | ICD-10-CM

## 2019-09-02 DIAGNOSIS — M6281 Muscle weakness (generalized): Secondary | ICD-10-CM

## 2019-09-02 NOTE — Therapy (Signed)
[facility] [address], [address] [place], [place], [address] Phone: [phone]   Fax:  [phone]  Physical Therapy Treatment  Patient Details  Name: Regina Goodman MRN: [id number] Date of Birth: 10/05/1965 Referring Provider (PT): Truitt Merle, MD   Encounter Date: 09/02/2019  PT End of Session - 09/02/19 1138    Visit Number  18    Date for PT Re-Evaluation  10/08/19    Authorization Type  [employer]    PT Start Time  1055    PT Stop Time  1141    PT Time Calculation (min)  46 min    Activity Tolerance  Patient tolerated treatment well    Behavior During Therapy  [facility] for tasks assessed/performed       History reviewed. No pertinent past medical history.  Past Surgical History:  Procedure Laterality Date  . EXTERNAL FIXATION LEG Left 06/04/2019   Procedure: EXTERNAL FIXATION LEG;  Surgeon: Bjorn Pippin, MD;  Location: [facility] OR;  Service: Orthopedics;  Laterality: Left;  . OPEN REDUCTION INTERNAL FIXATION (ORIF) TIBIA/FIBULA FRACTURE Left 06/06/2019   Procedure: OPEN REDUCTION INTERNAL FIXATION (ORIF) TIBIA/FIBULA FRACTURE;  Surgeon: Roby Lofts, MD;  Location: [facility];  Service: Orthopedics;  Laterality: Left;  . ORIF WRIST FRACTURE Left 06/04/2019  . ORIF WRIST FRACTURE Left 06/04/2019   Procedure: OPEN REDUCTION INTERNAL FIXATION (ORIF) WRIST FRACTURE;  Surgeon: Bjorn Pippin, MD;  [facility];  Service: Orthopedics;  Laterality: Left;    There were no vitals filed for this visit.  Subjective Assessment - 09/02/19 1057    Subjective  I have pain and a knot in my Lt arch.  I have been trying to massage it.    Currently in Pain?  Yes    Pain Score  4     Pain Location  Foot    Pain Orientation  Left    Pain Descriptors / Indicators  Sore    Pain Type  Acute pain    Pain Onset  1 to 4 weeks ago    Pain Frequency  Intermittent    Aggravating Factors   standing/walking    Pain Relieving Factors  non  weightbearing         [facility] PT Assessment - 09/02/19 0001      Palpation   Palpation comment  small nodule Lt arch at 2nd metatarsal.  Minimal tenderness, nodule is mobile                    [facility] Adult PT Treatment/Exercise - 09/02/19 0001      Knee/Hip Exercises: Aerobic   Nustep  L4 x 10 min- PT present for goal assessment.       Knee/Hip Exercises: Machines for Strengthening   Total Gym Leg Press  Bil calf press Seat 5: 35# 2x10, VC to not lock knees, control, single leg press: 30# 2x10 with emphasis on control      Knee/Hip Exercises: Standing   Step Down  Left;2 sets;10 reps;Step Height: 4"    Step Down Limitations  max tactile and verbal cues for technique    SLS  on blue pod 3x20 seconds    Rebounder  weight shifting 3 ways x 1 minute each   Side to side with dynamic movement for 4th set   Walking with Sports Cord  15 # forward and reverse x 10 each      Ankle Exercises: Doctor, hospital  3 reps;30 seconds   using rockerboard              PT Short Term Goals - 08/21/19 1149      PT SHORT TERM GOAL #1   Title  be independent in initial HEP    Time  4    Period  Weeks    Status  Achieved    Target Date  07/22/19      PT SHORT TERM GOAL #2   Title  demonstrate Lt grip strength to > or = to 25# to improve ability to hold objects in the Lt hand    Time  4    Period  Weeks    Status  Achieved    Target Date  07/22/19      PT SHORT TERM GOAL #3   Title  demonstrate gait with PWB as prescribed by MD with use of walker    Period  Weeks    Status  Achieved    Target Date  07/22/19        PT Long Term Goals - 08/28/19 1114      PT LONG TERM GOAL #6   Title  ambulate with WBAT with cane or walker as prescribed by MD for all distances    Status  Achieved            Plan - 09/02/19 1106    Clinical Impression Statement  Pt arrived with complaint of knot and soreness in the Rt foot.  Pt is doing well out of the boot and  is able to do more in weightbearing due to improved endurance and strength of the Lt LE.  Pt denies any complaints with the Lt wrist.  Pt remains functionally weak in the Lt wrist, ankle and hip and session focused on this.  Pt requires verbal cuing for lack of control with ther-ex due to weakness. Pt requires verbal cuing with single leg stance to contract her gluteals and quads more and requires some assistance from 2 fingers. PT provided verbal cues to improve stance time on the Lt foot to improve symmetry. Pt with palpable nodule at 2nd metatarsal on the Lt foot.  Mild tenderness over this area. PT advised pt to contact her MD regarding this spot.   Pt will continue to benefit from skilled PT to address Lt arm and leg strength s/p ORIF.    Rehab Potential  Excellent    PT Frequency  2x / week    PT Duration  8 weeks    PT Treatment/Interventions  ADLs/Self Care Home Management;Cryotherapy;Electrical Stimulation;Aquatic Therapy;Moist Heat;Traction;Ultrasound;Neuromuscular re-education;Therapeutic exercise;Therapeutic activities;Gait training;Stair training;Functional mobility training;Patient/family education;Manual techniques;Passive range of motion;Scar mobilization;Taping;Vasopneumatic Device    PT Next Visit Plan  Lt ankle strength, proprioception, gait, Lt wrist strength as needed    PT Home Exercise Plan  Access Code: [email or web address]    Consulted and Agree with Plan of Care  Patient       Patient will benefit from skilled therapeutic intervention in order to improve the following deficits and impairments:  Decreased activity tolerance, Decreased balance, Decreased mobility, Decreased strength, Impaired flexibility, Decreased scar mobility, Pain, Impaired UE functional use, Decreased endurance, Abnormal gait, Decreased range of motion, Difficulty walking  Visit Diagnosis: Other abnormalities of gait and mobility  Muscle weakness (generalized)  Pain in left lower leg     Problem List Patient  Active Problem List   Diagnosis Date Noted  . Fracture of tibial shaft, closed 06/07/2019  . Closed left pilon  fracture, initial encounter 06/07/2019  . Fall 06/07/2019  . Closed fracture of left distal radius 06/04/2019    Sigurd Sos, PT 09/02/19 11:40 AM  [facility] [address] [place], [place], [address] Phone: [phone]   Fax:  [phone]  Name: Regina Goodman MRN: [id number] Date of Birth: 1966/03/10

## 2019-09-04 ENCOUNTER — Other Ambulatory Visit: Payer: Self-pay

## 2019-09-04 ENCOUNTER — Ambulatory Visit: Payer: BC Managed Care – PPO | Admitting: Physical Therapy

## 2019-09-04 ENCOUNTER — Encounter: Payer: Self-pay | Admitting: Physical Therapy

## 2019-09-04 DIAGNOSIS — M6281 Muscle weakness (generalized): Secondary | ICD-10-CM

## 2019-09-04 DIAGNOSIS — M79662 Pain in left lower leg: Secondary | ICD-10-CM

## 2019-09-04 DIAGNOSIS — M25532 Pain in left wrist: Secondary | ICD-10-CM

## 2019-09-04 DIAGNOSIS — R2689 Other abnormalities of gait and mobility: Secondary | ICD-10-CM

## 2019-09-04 NOTE — Therapy (Signed)
[facility] [address], [place], [place], [address] Phone: [phone]   Fax:  [phone]  Physical Therapy Treatment  Patient Details  Name: Regina Goodman MRN: [id number] Date of Birth: Mar 20, 1966 Referring Provider (PT): Katha Hamming, MD   Encounter Date: 09/04/2019  PT End of Session - 09/04/19 1059    Visit Number  19    Date for PT Re-Evaluation  10/08/19    Authorization Type  [place]    PT Start Time  1058    PT Stop Time  1136    PT Time Calculation (min)  38 min    Activity Tolerance  Patient tolerated treatment well    Behavior During Therapy  [facility] for tasks assessed/performed       History reviewed. No pertinent past medical history.  Past Surgical History:  Procedure Laterality Date  . EXTERNAL FIXATION LEG Left 06/04/2019   Procedure: EXTERNAL FIXATION LEG;  Surgeon: Hiram Gash, MD;  Location: [place];  Service: Orthopedics;  Laterality: Left;  . OPEN REDUCTION INTERNAL FIXATION (ORIF) TIBIA/FIBULA FRACTURE Left 06/06/2019   Procedure: OPEN REDUCTION INTERNAL FIXATION (ORIF) TIBIA/FIBULA FRACTURE;  Surgeon: Shona Needles, MD;  Location: [place];  Service: Orthopedics;  Laterality: Left;  . ORIF WRIST FRACTURE Left 06/04/2019  . ORIF WRIST FRACTURE Left 06/04/2019   Procedure: OPEN REDUCTION INTERNAL FIXATION (ORIF) WRIST FRACTURE;  Surgeon: Hiram Gash, MD;  Location: [facility] [place];  Service: Orthopedics;  Laterality: Left;    There were no vitals filed for this visit.  Subjective Assessment - 09/04/19 1100    Subjective  I am walking better everyday.    Pertinent History  ORIF Lt radius (06/04/19), ORIF Lt tibia (05/1119)  MD orders: NWB Lt LE, allow for active and passive ROM ankle, strengthening Lt LE, likely advance WB in 4 weeks    Currently in Pain?  No/denies                        [facility] Adult PT Treatment/Exercise - 09/04/19 0001      Knee/Hip Exercises: Aerobic    Elliptical  R2 L2 x 5 min      Knee/Hip Exercises: Machines for Strengthening   Total Gym Leg Press  Bil Calf press seat 5 35# 2x20, LTLE leg press 2x10   TC on Leg press for correct alignement     Knee/Hip Exercises: Standing   Step Down  Left;1 set;5 reps;Hand Hold: 1;Step Height: 6"    SLS  on blue pod 3x30 seconds    Other Standing Knee Exercises  lateral steps blue, below knee 4x length  of gym      Ankle Exercises: Stretches   Gastroc Stretch  3 reps;30 seconds   using rockerboard     Ankle Exercises: Aerobic   Tread Mill  20mph x 5 min VC to lengthen steps out               PT Short Term Goals - 08/21/19 1149      PT SHORT TERM GOAL #1   Title  be independent in initial HEP    Time  4    Period  Weeks    Status  Achieved    Target Date  07/22/19      PT SHORT TERM GOAL #2   Title  demonstrate Lt grip strength to > or = to 25# to improve ability to hold objects in the Lt  hand    Time  4    Period  Weeks    Status  Achieved    Target Date  07/22/19      PT SHORT TERM GOAL #3   Title  demonstrate gait with PWB as prescribed by MD with use of walker    Period  Weeks    Status  Achieved    Target Date  07/22/19        PT Long Term Goals - 08/28/19 1114      PT LONG TERM GOAL #6   Title  ambulate with WBAT with cane or walker as prescribed by MD for all distances    Status  Achieved            Plan - 09/04/19 1059    Clinical Impression Statement  Pt ambulates into clinic with improved gait, improved ankle motion. Pt could single leg balance on blue pod 30 sec without UE support today. Pt walked on treadmill for 8 min with and without UE support to decrease limp. Pt coulld demonstrate step downs with min UE and good technique.    Personal Factors and Comorbidities  Comorbidity 1    Comorbidities  fall and ORIF    Examination-Activity Limitations  Dressing;Locomotion Level;Stand    Examination-Participation Restrictions  Shop;Meal Prep     Stability/Clinical Decision Making  Evolving/Moderate complexity    Rehab Potential  Excellent    PT Frequency  2x / week    PT Duration  8 weeks    PT Treatment/Interventions  ADLs/Self Care Home Management;Cryotherapy;Electrical Stimulation;Aquatic Therapy;Moist Heat;Traction;Ultrasound;Neuromuscular re-education;Therapeutic exercise;Therapeutic activities;Gait training;Stair training;Functional mobility training;Patient/family education;Manual techniques;Passive range of motion;Scar mobilization;Taping;Vasopneumatic Device    PT Next Visit Plan  Lt ankle strength, proprioception, gait, Lt wrist strength as needed    PT Home Exercise Plan  Access Code: [email or web address]    Consulted and Agree with Plan of Care  Patient       Patient will benefit from skilled therapeutic intervention in order to improve the following deficits and impairments:  Decreased activity tolerance, Decreased balance, Decreased mobility, Decreased strength, Impaired flexibility, Decreased scar mobility, Pain, Impaired UE functional use, Decreased endurance, Abnormal gait, Decreased range of motion, Difficulty walking  Visit Diagnosis: Other abnormalities of gait and mobility  Muscle weakness (generalized)  Pain in left lower leg  Pain in left wrist     Problem List Patient Active Problem List   Diagnosis Date Noted  . Fracture of tibial shaft, closed 06/07/2019  . Closed left pilon fracture, initial encounter 06/07/2019  . Fall 06/07/2019  . Closed fracture of left distal radius 06/04/2019    Geofrey Silliman, PTA 09/04/2019, 11:34 AM   [facility] [address], [address] [place], [place], [address] Phone: [phone]   Fax:  [phone]  Name: Regina Goodman MRN: [id number] Date of Birth: June 18, 1965

## 2019-09-09 ENCOUNTER — Ambulatory Visit: Payer: BC Managed Care – PPO

## 2019-09-09 ENCOUNTER — Other Ambulatory Visit: Payer: Self-pay

## 2019-09-09 DIAGNOSIS — R2689 Other abnormalities of gait and mobility: Secondary | ICD-10-CM

## 2019-09-09 DIAGNOSIS — M25532 Pain in left wrist: Secondary | ICD-10-CM | POA: Diagnosis not present

## 2019-09-09 DIAGNOSIS — M79662 Pain in left lower leg: Secondary | ICD-10-CM

## 2019-09-09 DIAGNOSIS — M6281 Muscle weakness (generalized): Secondary | ICD-10-CM

## 2019-09-09 NOTE — Therapy (Signed)
[facility] [address], [address] [place], [place], [address] Phone: [phone]   Fax:  [phone]  Physical Therapy Treatment  Patient Details  Name: Regina Goodman MRN: [id number] Date of Birth: 30-Jul-1965 Referring Provider (PT): Truitt Merle, MD   Encounter Date: 09/09/2019   PT End of Session - 09/09/19 1133    Visit Number 20    Date for PT Re-Evaluation 10/08/19    Authorization Type [employer]    PT Start Time 1054    PT Stop Time 1135    PT Time Calculation (min) 41 min    Activity Tolerance Patient tolerated treatment well    Behavior During Therapy [facility] for tasks assessed/performed           History reviewed. No pertinent past medical history.  Past Surgical History:  Procedure Laterality Date  . EXTERNAL FIXATION LEG Left 06/04/2019   Procedure: EXTERNAL FIXATION LEG;  Surgeon: Bjorn Pippin, MD;  [facility];  Service: Orthopedics;  Laterality: Left;  . OPEN REDUCTION INTERNAL FIXATION (ORIF) TIBIA/FIBULA FRACTURE Left 06/06/2019   Procedure: OPEN REDUCTION INTERNAL FIXATION (ORIF) TIBIA/FIBULA FRACTURE;  Surgeon: Roby Lofts, MD;  [facility];  Service: Orthopedics;  Laterality: Left;  . ORIF WRIST FRACTURE Left 06/04/2019  . ORIF WRIST FRACTURE Left 06/04/2019   Procedure: OPEN REDUCTION INTERNAL FIXATION (ORIF) WRIST FRACTURE;  Surgeon: Bjorn Pippin, MD;  [facility];  Service: Orthopedics;  Laterality: Left;    There were no vitals filed for this visit.   Subjective Assessment - 09/09/19 1109    Subjective I'm feeling good.  No issues with using my lt hand.    Pertinent History ORIF Lt radius (06/04/19), ORIF Lt tibia (05/1119)  MD orders: NWB Lt LE, allow for active and passive ROM ankle, strengthening Lt LE, likely advance WB in 4 weeks    Currently in Pain? No/denies                             [facility] Adult PT Treatment/Exercise - 09/09/19 0001       Knee/Hip Exercises: Aerobic   Elliptical R2 L2 x 5 min    Tread Mill 1.6 mph x 7 minutes- verbal and demo cues for symmetry      Knee/Hip Exercises: Machines for Strengthening   Total Gym Leg Press Bil Calf press seat 5 35# 2x20, LTLE leg press 2x10   TC on Leg press for correct alignment     Knee/Hip Exercises: Standing   Step Down Left;1 set;5 reps;Hand Hold: 1;Step Height: 6"    SLS on blue pod 3x30 seconds    Walking with Sports Cord 15 # forward and reverse x 10 each      Ankle Exercises: Stretches   Gastroc Stretch 3 reps;30 seconds   using rockerboard                   PT Short Term Goals - 08/21/19 1149      PT SHORT TERM GOAL #1   Title be independent in initial HEP    Time 4    Period Weeks    Status Achieved    Target Date 07/22/19      PT SHORT TERM GOAL #2   Title demonstrate Lt grip strength to > or = to 25# to improve ability to hold objects in the Lt hand    Time 4  Period Weeks    Status Achieved    Target Date 07/22/19      PT SHORT TERM GOAL #3   Title demonstrate gait with PWB as prescribed by MD with use of walker    Period Weeks    Status Achieved    Target Date 07/22/19             PT Long Term Goals - 09/09/19 1109      PT LONG TERM GOAL #1   Title be independent in advanced HEP    Time 8    Period Weeks    Status On-going      PT LONG TERM GOAL #6   Title ambulate with WBAT with cane or walker as prescribed by MD for all distances    Status Achieved                 Plan - 09/09/19 1126    Clinical Impression Statement Pt ambulates into clinic with improved gait, symmetry and heel strike due to improved ankle motion. Pt was able to work at the [employer] x 6 hours with some fatigue in the Lt ankle at the end of her shift.  Pt could perform single leg balance on blue pod 30 sec with  .  Pt requires tactile and verbal cues for hip alignment with leg press and 6" step-downs.  Pt required max verbal and demo cues  for symmetry with step length and time spent in stance on the Lt with gait on the treadmill. Pt demonstrated fatigue with mass grip using green digicizer today.  Pt will continue to benefit from skilled PT to address Lt LE stability, ROM, gait and Lt hand and wrist endurance and strength.    PT Frequency 2x / week    PT Duration 8 weeks    PT Treatment/Interventions ADLs/Self Care Home Management;Cryotherapy;Electrical Stimulation;Aquatic Therapy;Moist Heat;Traction;Ultrasound;Neuromuscular re-education;Therapeutic exercise;Therapeutic activities;Gait training;Stair training;Functional mobility training;Patient/family education;Manual techniques;Passive range of motion;Scar mobilization;Taping;Vasopneumatic Device    PT Next Visit Plan Lt ankle strength, proprioception, gait, Lt wrist strength as needed.  work on Systems developer.  Test ankle and wrist strength    PT Home Exercise Plan Access Code: [email or web address]    Consulted and Agree with Plan of Care Patient           Patient will benefit from skilled therapeutic intervention in order to improve the following deficits and impairments:  Decreased activity tolerance, Decreased balance, Decreased mobility, Decreased strength, Impaired flexibility, Decreased scar mobility, Pain, Impaired UE functional use, Decreased endurance, Abnormal gait, Decreased range of motion, Difficulty walking  Visit Diagnosis: Other abnormalities of gait and mobility  Muscle weakness (generalized)  Pain in left lower leg  Pain in left wrist     Problem List Patient Active Problem List   Diagnosis Date Noted  . Fracture of tibial shaft, closed 06/07/2019  . Closed left pilon fracture, initial encounter 06/07/2019  . Fall 06/07/2019  . Closed fracture of left distal radius 06/04/2019   Sigurd Sos, PT 09/09/19 11:41 AM  [facility] [address], [place], [place], [address] Phone: [phone]   Fax:   [phone]  Name: Regina Goodman MRN: [id number] Date of Birth: 08-05-1965

## 2019-09-11 ENCOUNTER — Encounter: Payer: Self-pay | Admitting: Physical Therapy

## 2019-09-11 ENCOUNTER — Other Ambulatory Visit: Payer: Self-pay

## 2019-09-11 ENCOUNTER — Ambulatory Visit: Payer: BC Managed Care – PPO | Admitting: Physical Therapy

## 2019-09-11 DIAGNOSIS — M25532 Pain in left wrist: Secondary | ICD-10-CM

## 2019-09-11 DIAGNOSIS — M6281 Muscle weakness (generalized): Secondary | ICD-10-CM

## 2019-09-11 DIAGNOSIS — M79662 Pain in left lower leg: Secondary | ICD-10-CM

## 2019-09-11 DIAGNOSIS — R2689 Other abnormalities of gait and mobility: Secondary | ICD-10-CM

## 2019-09-11 NOTE — Therapy (Signed)
[facility] [address] [place], [place], [address] Phone: [phone]   Fax:  [phone]  Physical Therapy Treatment  Patient Details  Name: Danyela Posas MRN: [id number] Date of Birth: 1965-08-06 Referring Provider (PT): Truitt Merle, MD   Encounter Date: 09/11/2019   PT End of Session - 09/11/19 1057    Visit Number 21    Date for PT Re-Evaluation 10/08/19    Authorization Type [employer]    PT Start Time 1057    PT Stop Time 1136    PT Time Calculation (min) 39 min    Activity Tolerance Patient tolerated treatment well    Behavior During Therapy [facility] for tasks assessed/performed           History reviewed. No pertinent past medical history.  Past Surgical History:  Procedure Laterality Date  . EXTERNAL FIXATION LEG Left 06/04/2019   Procedure: EXTERNAL FIXATION LEG;  Surgeon: Bjorn Pippin, MD;  Location: [facility] OR;  Service: Orthopedics;  Laterality: Left;  . OPEN REDUCTION INTERNAL FIXATION (ORIF) TIBIA/FIBULA FRACTURE Left 06/06/2019   Procedure: OPEN REDUCTION INTERNAL FIXATION (ORIF) TIBIA/FIBULA FRACTURE;  Surgeon: Roby Lofts, MD;  Location: [facility] OR;  Service: Orthopedics;  Laterality: Left;  . ORIF WRIST FRACTURE Left 06/04/2019  . ORIF WRIST FRACTURE Left 06/04/2019   Procedure: OPEN REDUCTION INTERNAL FIXATION (ORIF) WRIST FRACTURE;  Surgeon: Bjorn Pippin, MD;  [facility]: [facility] OR;  Service: Orthopedics;  Laterality: Left;    There were no vitals filed for this visit.   Subjective Assessment - 09/11/19 1059    Subjective The front of my ankle is pretty sore. Let's hold off on the treadmill today.    Pertinent History ORIF Lt radius (06/04/19), ORIF Lt tibia (05/1119)  MD orders: NWB Lt LE, allow for active and passive ROM ankle, strengthening Lt LE, likely advance WB in 4 weeks    Currently in Pain? Yes   anterior ankle sore: 7/10                            [facility] Adult  PT Treatment/Exercise - 09/11/19 0001      Knee/Hip Exercises: Aerobic   Elliptical R3 L3 x 6 min       Knee/Hip Exercises: Machines for Strengthening   Total Gym Leg Press Bil Calf press seat 5 35# 2x20, LTLE leg press 2x15 30# reduced weight some to eliminate initiating movement from lumbar      Knee/Hip Exercises: Standing   Walking with Sports Cord 15 # forward and reverse x 10 each   Vc to contract quads and buttocks more   Other Standing Knee Exercises reverse lunges 10x, added to HEP      Ankle Exercises: Stretches   Gastroc Stretch 3 reps;30 seconds   using rockerboard   Other Stretch DF stretch on 2nd step 10x, then  hold at end range 10 sec 5x      Ankle Exercises: Standing   Rocker Board 2 minutes   DF/PF   Other Standing Ankle Exercises floor slider under RT foot: 10c each direction                  PT Education - 09/11/19 1135    Education Details HEP, added reverse lunges    Person(s) Educated Patient    Methods Explanation;Demonstration;Verbal cues   Declined handout, put in [employer]   Comprehension Verbalized understanding;Returned demonstration  PT Short Term Goals - 08/21/19 1149      PT SHORT TERM GOAL #1   Title be independent in initial HEP    Time 4    Period Weeks    Status Achieved    Target Date 07/22/19      PT SHORT TERM GOAL #2   Title demonstrate Lt grip strength to > or = to 25# to improve ability to hold objects in the Lt hand    Time 4    Period Weeks    Status Achieved    Target Date 07/22/19      PT SHORT TERM GOAL #3   Title demonstrate gait with PWB as prescribed by MD with use of walker    Period Weeks    Status Achieved    Target Date 07/22/19             PT Long Term Goals - 09/09/19 1109      PT LONG TERM GOAL #1   Title be independent in advanced HEP    Time 8    Period Weeks    Status On-going      PT LONG TERM GOAL #6   Title ambulate with WBAT with cane or walker as prescribed by  MD for all distances    Status Achieved                 Plan - 09/11/19 1057    Clinical Impression Statement Pt arrives with complaints of anterior ankle soreness most likely from all her increased activity including treadmill walking in the clinic. Per pt request we left off the treadmill walking and focused more on stretching for todays treatment. PTA encouraged pt to use some ice at home for soreness. Pt demonstrates less limping with resisted walking when she contracts her quads and glutes. Pt  has difficulty using the floo rslider under her RTLE moving in and out of the different planes of movement. Movements are not smooth but rather jerky due to weakness.    Personal Factors and Comorbidities Comorbidity 1    Comorbidities fall and ORIF    Examination-Activity Limitations Dressing;Locomotion Level;Stand    Examination-Participation Restrictions Shop;Meal Prep    Stability/Clinical Decision Making Evolving/Moderate complexity    Rehab Potential Excellent    PT Frequency 2x / week    PT Duration 8 weeks    PT Treatment/Interventions ADLs/Self Care Home Management;Cryotherapy;Electrical Stimulation;Aquatic Therapy;Moist Heat;Traction;Ultrasound;Neuromuscular re-education;Therapeutic exercise;Therapeutic activities;Gait training;Stair training;Functional mobility training;Patient/family education;Manual techniques;Passive range of motion;Scar mobilization;Taping;Vasopneumatic Device    PT Next Visit Plan Lt ankle strength, proprioception, gait, Lt wrist strength as needed.  work on Systems developer.  Test ankle and wrist strength    PT Home Exercise Plan Access Code: [email or web address]    Consulted and Agree with Plan of Care Patient           Patient will benefit from skilled therapeutic intervention in order to improve the following deficits and impairments:  Decreased activity tolerance, Decreased balance, Decreased mobility, Decreased strength, Impaired flexibility, Decreased scar mobility,  Pain, Impaired UE functional use, Decreased endurance, Abnormal gait, Decreased range of motion, Difficulty walking  Visit Diagnosis: Other abnormalities of gait and mobility  Muscle weakness (generalized)  Pain in left lower leg  Pain in left wrist     Problem List Patient Active Problem List   Diagnosis Date Noted  . Fracture of tibial shaft, closed 06/07/2019  . Closed left pilon fracture, initial encounter 06/07/2019  . Fall 06/07/2019  . Closed  fracture of left distal radius 06/04/2019    Dellia Donnelly, PTA 09/11/2019, 11:36 AM  [facility] [address], [address] [place], [place], [address] Phone: [phone]   Fax:  [phone]  Name: Yolando Gillum MRN: [id number] Date of Birth: Mar 06, 1966 Access Code: [email or web address]: [email or web address]: 06/16/2021Prepared by: Victorino Dike CochranExercises  Standing Wrist Flexion Stretch - 3 x daily - 7 x weekly - 3 reps - 1 sets - 20 hold  Standing Wrist Extension Stretch - 3 x daily - 7 x weekly - 3 reps - 1 sets - 20 hold  Wrist Extension AROM - 3 x daily - 7 x weekly - 2 sets - 10 reps  Wrist Flexion AROM - 3 x daily - 7 x weekly - 2 sets - 10 reps  Wrist Radial Deviation AROM - 3 x daily - 7 x weekly - 2 sets - 10 reps  Seated Forearm Pronation and Supination AROM - 3 x daily - 7 x weekly - 2 sets - 10 reps  Supine Single Leg Ankle Pumps - 3 x daily - 7 x weekly - 3 sets - 10 reps  Supine Ankle Inversion AROM - 3 x daily - 7 x weekly - 3 sets - 10 reps  Supine Ankle Eversion AROM - 3 x daily - 7 x weekly - 3 sets - 10 reps  Seated Ankle Alphabet - 1 x daily - 7 x weekly - 10 reps - 3 sets  Seated Ankle Alphabet - 7 x weekly  Sidelying Hip Abduction - 3 x daily - 7 x weekly - 2 sets - 10 reps  Long Sitting Ankle Plantar Flexion with Resistance - 2 x daily - 7 x weekly - 2 sets - 15 reps  Long Sitting Ankle Dorsiflexion with Anchored Resistance - 2 x  daily - 7 x weekly - 2 sets - 15 reps  Standing Heel Raise - 2 x daily - 7 x weekly - 2 sets - 10 reps  Single Leg Stance - 3 x daily - 7 x weekly - 3 sets - 15 hold  Standing Gastroc Stretch - 3 x daily - 7 x weekly - 1 sets - 10 reps - 30 hold  Standing Ankle Dorsiflexion Stretch on Chair - 2 x daily - 7 x weekly - 2 sets - 20 reps - 10sec hold  Standing Partial Lunge - 1 x daily - 7 x weekly - 2 sets - 15 reps

## 2019-09-16 ENCOUNTER — Other Ambulatory Visit: Payer: Self-pay

## 2019-09-16 ENCOUNTER — Ambulatory Visit: Payer: BC Managed Care – PPO | Admitting: Physical Therapy

## 2019-09-16 ENCOUNTER — Encounter: Payer: Self-pay | Admitting: Physical Therapy

## 2019-09-16 DIAGNOSIS — M25532 Pain in left wrist: Secondary | ICD-10-CM

## 2019-09-16 DIAGNOSIS — M6281 Muscle weakness (generalized): Secondary | ICD-10-CM

## 2019-09-16 DIAGNOSIS — R2689 Other abnormalities of gait and mobility: Secondary | ICD-10-CM

## 2019-09-16 DIAGNOSIS — M79662 Pain in left lower leg: Secondary | ICD-10-CM

## 2019-09-16 NOTE — Therapy (Signed)
[facility] [address] [place], [place], [address] Phone: [phone]   Fax:  [phone]  Physical Therapy Treatment  Patient Details  Name: Regina Goodman MRN: [id number] Date of Birth: 03/03/1966 Referring Provider (PT): Truitt Merle, MD   Encounter Date: 09/16/2019   PT End of Session - 09/16/19 1103    Visit Number 22    Date for PT Re-Evaluation 10/08/19    Authorization Type [employer]    PT Start Time 1100    PT Stop Time 1155    PT Time Calculation (min) 55 min    Activity Tolerance Patient tolerated treatment well    Behavior During Therapy [facility] for tasks assessed/performed           History reviewed. No pertinent past medical history.  Past Surgical History:  Procedure Laterality Date  . EXTERNAL FIXATION LEG Left 06/04/2019   Procedure: EXTERNAL FIXATION LEG;  Surgeon: Bjorn Pippin, MD;  [facility]: [facility] OR;  Service: Orthopedics;  Laterality: Left;  . OPEN REDUCTION INTERNAL FIXATION (ORIF) TIBIA/FIBULA FRACTURE Left 06/06/2019   Procedure: OPEN REDUCTION INTERNAL FIXATION (ORIF) TIBIA/FIBULA FRACTURE;  Surgeon: Roby Lofts, MD;  Location: [facility];  Service: Orthopedics;  Laterality: Left;  . ORIF WRIST FRACTURE Left 06/04/2019  . ORIF WRIST FRACTURE Left 06/04/2019   Procedure: OPEN REDUCTION INTERNAL FIXATION (ORIF) WRIST FRACTURE;  Surgeon: Bjorn Pippin, MD;  [facility];  Service: Orthopedics;  Laterality: Left;    There were no vitals filed for this visit.   Subjective Assessment - 09/16/19 1104    Subjective Pt worked at [employer] Saturday, standing on concrete, anterior ankle remains sore and appears to have some increased swelling.    Pertinent History ORIF Lt radius (06/04/19), ORIF Lt tibia (05/1119)  MD orders: NWB Lt LE, allow for active and passive ROM ankle, strengthening Lt LE, likely advance WB in 4 weeks    Currently in Pain? Yes    Pain Score 4     Pain  Location Ankle    Pain Orientation Left;Anterior    Pain Descriptors / Indicators Sore    Aggravating Factors  Maybe all the standing I have done lately, especially on the concrete    Pain Relieving Factors ice, rest    Multiple Pain Sites No                             [facility] Adult PT Treatment/Exercise - 09/16/19 0001      Knee/Hip Exercises: Aerobic   Elliptical R3 L4 x 8 min      Knee/Hip Exercises: Machines for Strengthening   Total Gym Leg Press Bil calf press 40# 2x15, LTLE leg press 30#2x20   VC for LE alignment     Knee/Hip Exercises: Standing   SLS on blue pod 3x30 seconds   1 finger dabbing counter top   Rebounder weightshifting with increased speed 1 min each way    Other Standing Knee Exercises blue band at ankles: side stepping 4x 30 feet   Vc to lead with Lt heel vs turning foot out   Other Standing Knee Exercises reverse lunges 10x2,    VC to contract Lt quads & glutes more     Hand Exercises   Digiticizer green 2 min      Vasopneumatic   Number Minutes Vasopneumatic  15 minutes    Vasopnuematic Location  Ankle    Vasopneumatic  Pressure High    Vasopneumatic Temperature  3 flakes                    PT Short Term Goals - 08/21/19 1149      PT SHORT TERM GOAL #1   Title be independent in initial HEP    Time 4    Period Weeks    Status Achieved    Target Date 07/22/19      PT SHORT TERM GOAL #2   Title demonstrate Lt grip strength to > or = to 25# to improve ability to hold objects in the Lt hand    Time 4    Period Weeks    Status Achieved    Target Date 07/22/19      PT SHORT TERM GOAL #3   Title demonstrate gait with PWB as prescribed by MD with use of walker    Period Weeks    Status Achieved    Target Date 07/22/19             PT Long Term Goals - 09/09/19 1109      PT LONG TERM GOAL #1   Title be independent in advanced HEP    Time 8    Period Weeks    Status On-going      PT LONG TERM GOAL #6   Title  ambulate with WBAT with cane or walker as prescribed by MD for all distances    Status Achieved                 Plan - 09/16/19 1103    Clinical Impression Statement Pt remains with complaints of Lt anterior ankle pain/soreness. Ankle does apprear to have increased edema which we can attribute to her working more hours at the [employer] and standing on the concrete. Game ready was resumed to help decrease the swellig. pt demonstrates Lt hip weakness during lunges. She did improve with practice as she began with more of a ratchet like movement comin gout of her lunge and evenually was able to use her glutes and quads better to make a smoother, more controlled movement.    Personal Factors and Comorbidities Comorbidity 1    Comorbidities fall and ORIF    Examination-Activity Limitations Dressing;Locomotion Level;Stand    Examination-Participation Restrictions Shop;Meal Prep    Stability/Clinical Decision Making Evolving/Moderate complexity    Rehab Potential Excellent    PT Frequency 2x / week    PT Duration 8 weeks    PT Treatment/Interventions ADLs/Self Care Home Management;Cryotherapy;Electrical Stimulation;Aquatic Therapy;Moist Heat;Traction;Ultrasound;Neuromuscular re-education;Therapeutic exercise;Therapeutic activities;Gait training;Stair training;Functional mobility training;Patient/family education;Manual techniques;Passive range of motion;Scar mobilization;Taping;Vasopneumatic Device    PT Next Visit Plan Lt ankle strength, proprioception, gait, Lt wrist strength as needed.  work on Environmental health practitioner.  Test ankle and wrist strength nex tvisit.    PT Home Exercise Plan Access Code: [email or web address]    Consulted and Agree with Plan of Care Patient           Patient will benefit from skilled therapeutic intervention in order to improve the following deficits and impairments:  Decreased activity tolerance, Decreased balance, Decreased mobility, Decreased strength, Impaired flexibility,  Decreased scar mobility, Pain, Impaired UE functional use, Decreased endurance, Abnormal gait, Decreased range of motion, Difficulty walking  Visit Diagnosis: Other abnormalities of gait and mobility  Muscle weakness (generalized)  Pain in left lower leg  Pain in left wrist     Problem List Patient Active Problem List   Diagnosis Date  Noted  . Fracture of tibial shaft, closed 06/07/2019  . Closed left pilon fracture, initial encounter 06/07/2019  . Fall 06/07/2019  . Closed fracture of left distal radius 06/04/2019    Winslow Ederer, PTA 09/16/2019, 11:43 AM  [facility] [address], [place], [place], [address] Phone: [phone]   Fax:  [phone]  Name: Regina Goodman MRN: [id number] Date of Birth: [birth year]

## 2019-09-23 ENCOUNTER — Other Ambulatory Visit: Payer: Self-pay

## 2019-09-23 ENCOUNTER — Ambulatory Visit: Payer: BC Managed Care – PPO

## 2019-09-23 DIAGNOSIS — M79662 Pain in left lower leg: Secondary | ICD-10-CM

## 2019-09-23 DIAGNOSIS — M25532 Pain in left wrist: Secondary | ICD-10-CM

## 2019-09-23 DIAGNOSIS — M6281 Muscle weakness (generalized): Secondary | ICD-10-CM

## 2019-09-23 DIAGNOSIS — R2689 Other abnormalities of gait and mobility: Secondary | ICD-10-CM

## 2019-09-23 NOTE — Therapy (Signed)
[facility] [address], [address] [place], [place], [address] Phone: [phone]   Fax:  [phone]  Physical Therapy Treatment  Patient Details  Name: Regina Goodman MRN: [id number] Date of Birth: 1965/04/11 Referring Provider (PT): Truitt Merle, MD   Encounter Date: 09/23/2019   PT End of Session - 09/23/19 1137    Visit Number 23    Date for PT Re-Evaluation 10/08/19    Authorization Type [employer]    PT Start Time 1100    PT Stop Time 1159    PT Time Calculation (min) 59 min    Activity Tolerance Patient tolerated treatment well    Behavior During Therapy [facility] for tasks assessed/performed           History reviewed. No pertinent past medical history.  Past Surgical History:  Procedure Laterality Date  . EXTERNAL FIXATION LEG Left 06/04/2019   Procedure: EXTERNAL FIXATION LEG;  Surgeon: Bjorn Pippin, MD;  [facility];  Service: Orthopedics;  Laterality: Left;  . OPEN REDUCTION INTERNAL FIXATION (ORIF) TIBIA/FIBULA FRACTURE Left 06/06/2019   Procedure: OPEN REDUCTION INTERNAL FIXATION (ORIF) TIBIA/FIBULA FRACTURE;  Surgeon: Roby Lofts, MD;  [facility];  Service: Orthopedics;  Laterality: Left;  . ORIF WRIST FRACTURE Left 06/04/2019  . ORIF WRIST FRACTURE Left 06/04/2019   Procedure: OPEN REDUCTION INTERNAL FIXATION (ORIF) WRIST FRACTURE;  Surgeon: Bjorn Pippin, MD;  [facility];  Service: Orthopedics;  Laterality: Left;    There were no vitals filed for this visit.   Subjective Assessment - 09/23/19 1106    Subjective I think that I do better in flip flops than sneakers.    Currently in Pain? No/denies                             [facility] Adult PT Treatment/Exercise - 09/23/19 0001      Knee/Hip Exercises: Aerobic   Elliptical R3 L4 x 8 min    Other Aerobic Arm bike: level 1.5 x 6 minutes       Knee/Hip Exercises: Machines for Strengthening   Total Gym Leg Press  Bil calf press 40# 2x15, LTLE leg press 30#2x20   VC for LE alignment     Knee/Hip Exercises: Standing   SLS on blue pod 3x30 seconds   1 finger dabbing counter top   Rebounder weightshifting with increased speed 1 min each way      Hand Exercises   Digiticizer green 2 min      Vasopneumatic   Number Minutes Vasopneumatic  15 minutes    Vasopnuematic Location  Ankle    Vasopneumatic Pressure High    Vasopneumatic Temperature  3 flakes      Ankle Exercises: Standing   BAPS Level 2;Standing   PF/DF 2x10, inversion/eversion x10                   PT Short Term Goals - 08/21/19 1149      PT SHORT TERM GOAL #1   Title be independent in initial HEP    Time 4    Period Weeks    Status Achieved    Target Date 07/22/19      PT SHORT TERM GOAL #2   Title demonstrate Lt grip strength to > or = to 25# to improve ability to hold objects in the Lt hand    Time 4    Period Weeks  Status Achieved    Target Date 07/22/19      PT SHORT TERM GOAL #3   Title demonstrate gait with PWB as prescribed by MD with use of walker    Period Weeks    Status Achieved    Target Date 07/22/19             PT Long Term Goals - 09/23/19 1110      PT LONG TERM GOAL #1   Title be independent in advanced HEP    Time 8    Period Weeks    Status On-going      PT LONG TERM GOAL #4   Title demonstrate Lt grip strength to > or  = to 45# to improve ability to hold objects in the [place] - 09/23/19 1133    Clinical Impression Statement Pt continues to have to have increased edema which we can attribute to her working more hours at the [employer] and standing on the concrete x 1 day over the weekend. Game ready was resumed to help decrease the swelling. Pt demonstrated improved proprioception with ability to perform Baps board standing. Pt denies any changes in use of her Lt hand or wrist with functional tasks.  Pt will continue to benefit from skilled PT to  address Lt ankle flexibility, gait and proprioception to normalize gait pattern and return to prior level of function.    PT Treatment/Interventions ADLs/Self Care Home Management;Cryotherapy;Electrical Stimulation;Aquatic Therapy;Moist Heat;Traction;Ultrasound;Neuromuscular re-education;Therapeutic exercise;Therapeutic activities;Gait training;Stair training;Functional mobility training;Patient/family education;Manual techniques;Passive range of motion;Scar mobilization;Taping;Vasopneumatic Device    PT Next Visit Plan FOTO for wrist, check Lt ankle strength.  continue to work on gait, strength and priprioception    PT Home Exercise Plan Access Code: [email or web address]    Consulted and Agree with Plan of Care Patient           Patient will benefit from skilled therapeutic intervention in order to improve the following deficits and impairments:  Decreased activity tolerance, Decreased balance, Decreased mobility, Decreased strength, Impaired flexibility, Decreased scar mobility, Pain, Impaired UE functional use, Decreased endurance, Abnormal gait, Decreased range of motion, Difficulty walking  Visit Diagnosis: Other abnormalities of gait and mobility  Muscle weakness (generalized)  Pain in left lower leg  Pain in left wrist     Problem List Patient Active Problem List   Diagnosis Date Noted  . Fracture of tibial shaft, closed 06/07/2019  . Closed left pilon fracture, initial encounter 06/07/2019  . Fall 06/07/2019  . Closed fracture of left distal radius 06/04/2019    Sigurd Sos, PT 09/23/19 11:45 AM  [facility] [address], [place], [place], [address] Phone: [phone]   Fax:  [phone]  Name: Regina Goodman MRN: [id number] Date of Birth: 16-Dec-1965

## 2019-09-25 ENCOUNTER — Ambulatory Visit: Payer: BC Managed Care – PPO

## 2019-09-25 ENCOUNTER — Other Ambulatory Visit: Payer: Self-pay

## 2019-09-25 ENCOUNTER — Other Ambulatory Visit: Payer: Self-pay | Admitting: Obstetrics and Gynecology

## 2019-09-25 DIAGNOSIS — R2689 Other abnormalities of gait and mobility: Secondary | ICD-10-CM

## 2019-09-25 DIAGNOSIS — M79662 Pain in left lower leg: Secondary | ICD-10-CM

## 2019-09-25 DIAGNOSIS — M6281 Muscle weakness (generalized): Secondary | ICD-10-CM

## 2019-09-25 DIAGNOSIS — Z1231 Encounter for screening mammogram for malignant neoplasm of breast: Secondary | ICD-10-CM

## 2019-09-25 DIAGNOSIS — M25532 Pain in left wrist: Secondary | ICD-10-CM

## 2019-09-25 NOTE — Therapy (Signed)
[facility] [address] [place], [place], [address] Phone: [phone]   Fax:  [phone]  Physical Therapy Treatment  Patient Details  Name: Regina Goodman MRN: [id number] Date of Birth: [birth year] Referring Provider (PT): Truitt Merle, MD   Encounter Date: 09/25/2019   PT End of Session - 09/25/19 1139    Visit Number 24    Date for PT Re-Evaluation 10/08/19    Authorization Type [employer]    PT Start Time 1100    PT Stop Time 1159    PT Time Calculation (min) 59 min    Activity Tolerance Patient tolerated treatment well    Behavior During Therapy [facility] for tasks assessed/performed           History reviewed. No pertinent past medical history.  Past Surgical History:  Procedure Laterality Date  . EXTERNAL FIXATION LEG Left 06/04/2019   Procedure: EXTERNAL FIXATION LEG;  Surgeon: Bjorn Pippin, MD;  Location: [facility] OR;  Service: Orthopedics;  Laterality: Left;  . OPEN REDUCTION INTERNAL FIXATION (ORIF) TIBIA/FIBULA FRACTURE Left 06/06/2019   Procedure: OPEN REDUCTION INTERNAL FIXATION (ORIF) TIBIA/FIBULA FRACTURE;  Surgeon: Roby Lofts, MD;  Location: [facility] OR;  Service: Orthopedics;  Laterality: Left;  . ORIF WRIST FRACTURE Left 06/04/2019  . ORIF WRIST FRACTURE Left 06/04/2019   Procedure: OPEN REDUCTION INTERNAL FIXATION (ORIF) WRIST FRACTURE;  Surgeon: Bjorn Pippin, MD;  [facility];  Service: Orthopedics;  Laterality: Left;    There were no vitals filed for this visit.   Subjective Assessment - 09/25/19 1105    Subjective I went to [employer] yesterday and did the bike and elliptical.    Pertinent History ORIF Lt radius (06/04/19), ORIF Lt tibia (05/1119)  MD orders: NWB Lt LE, allow for active and passive ROM ankle, strengthening Lt LE, likely advance WB in 4 weeks    Currently in Pain? No/denies              [facility] PT Assessment - 09/25/19 0001      Observation/Other Assessments     Focus on Therapeutic Outcomes (FOTO)  29% limitation (wrist)                         [facility] Adult PT Treatment/Exercise - 09/25/19 0001      Knee/Hip Exercises: Aerobic   Elliptical R3 L4 x 8 min    Other Aerobic Arm bike: level 1.5 x 6 minutes       Knee/Hip Exercises: Machines for Strengthening   Total Gym Leg Press Bil calf press 40# 2x15, LTLE leg press 30#2x20   VC for LE alignment     Knee/Hip Exercises: Standing   SLS on blue pod 3x30 seconds   1 finger dabbing counter top   Rebounder weightshifting with increased speed 1 min each way    Other Standing Knee Exercises wall push-ups 2x10      Hand Exercises   Digiticizer green 2 min      Vasopneumatic   Number Minutes Vasopneumatic  15 minutes    Vasopnuematic Location  Ankle    Vasopneumatic Pressure High    Vasopneumatic Temperature  3 flakes      Ankle Exercises: Standing   BAPS Level 2;Standing   PF/DF 2x10, inversion/eversion x10, bil circles x 10 each   Rocker Board 3 minutes   hold into stretch then balance on flat surface  PT Short Term Goals - 08/21/19 1149      PT SHORT TERM GOAL #1   Title be independent in initial HEP    Time 4    Period Weeks    Status Achieved    Target Date 07/22/19      PT SHORT TERM GOAL #2   Title demonstrate Lt grip strength to > or = to 25# to improve ability to hold objects in the Lt hand    Time 4    Period Weeks    Status Achieved    Target Date 07/22/19      PT SHORT TERM GOAL #3   Title demonstrate gait with PWB as prescribed by MD with use of walker    Period Weeks    Status Achieved    Target Date 07/22/19             PT Long Term Goals - 09/25/19 1118      PT LONG TERM GOAL #2   Title reduce FOTO for wrist to < or = to 36% limitation    Baseline 29%    Status Achieved                 Plan - 09/25/19 1112    Clinical Impression Statement Edema in Lt ankle is less due to not working at the [employer] over the past few days.  Pt demonstrated improved proprioception with ability to perform Baps board standing. Pt required minor assistance for board control. Pt denies any changes in use of her Lt hand or wrist with functional tasks.  FOTO for wrist is 29% limitation, improved from 61% limitation. Gait is mildly antalgic due to reduced ankle A/ROM.  Pt will continue to benefit from skilled PT to address Lt ankle flexibility, gait and proprioception to normalize gait pattern and return to prior level of function.    PT Frequency 2x / week    PT Duration 8 weeks    PT Treatment/Interventions ADLs/Self Care Home Management;Cryotherapy;Electrical Stimulation;Aquatic Therapy;Moist Heat;Traction;Ultrasound;Neuromuscular re-education;Therapeutic exercise;Therapeutic activities;Gait training;Stair training;Functional mobility training;Patient/family education;Manual techniques;Passive range of motion;Scar mobilization;Taping;Vasopneumatic Device    PT Next Visit Plan continue to work on gait, strength and priprioception.  Lt hand and wrist strength/endurnace as needed    PT Home Exercise Plan Access Code: [email or web address]    Consulted and Agree with Plan of Care Patient           Patient will benefit from skilled therapeutic intervention in order to improve the following deficits and impairments:  Decreased activity tolerance, Decreased balance, Decreased mobility, Decreased strength, Impaired flexibility, Decreased scar mobility, Pain, Impaired UE functional use, Decreased endurance, Abnormal gait, Decreased range of motion, Difficulty walking  Visit Diagnosis: Other abnormalities of gait and mobility  Muscle weakness (generalized)  Pain in left lower leg  Pain in left wrist     Problem List Patient Active Problem List   Diagnosis Date Noted  . Fracture of tibial shaft, closed 06/07/2019  . Closed left pilon fracture, initial encounter 06/07/2019  . Fall 06/07/2019  . Closed fracture of left  distal radius 06/04/2019     Lorrene Reid, PT 09/25/19 11:42 AM  [facility] [address] [place], [place], [address] Phone: [phone]   Fax:  [phone]  Name: Leira Regino MRN: [id number] Date of Birth: 1965-05-07

## 2019-10-01 ENCOUNTER — Ambulatory Visit: Payer: BC Managed Care – PPO | Attending: Student

## 2019-10-01 ENCOUNTER — Other Ambulatory Visit: Payer: Self-pay

## 2019-10-01 DIAGNOSIS — M79662 Pain in left lower leg: Secondary | ICD-10-CM | POA: Insufficient documentation

## 2019-10-01 DIAGNOSIS — R2689 Other abnormalities of gait and mobility: Secondary | ICD-10-CM | POA: Diagnosis present

## 2019-10-01 DIAGNOSIS — M25532 Pain in left wrist: Secondary | ICD-10-CM | POA: Insufficient documentation

## 2019-10-01 DIAGNOSIS — M6281 Muscle weakness (generalized): Secondary | ICD-10-CM | POA: Insufficient documentation

## 2019-10-01 NOTE — Therapy (Signed)
[facility] [address] [place], [place], [address] Phone: [phone]   Fax:  [phone]  Physical Therapy Treatment  Patient Details  Name: Regina Goodman MRN: [id number] Date of Birth: [birth year] Referring Provider (PT): Truitt Merle, MD   Encounter Date: 10/01/2019   PT End of Session - 10/01/19 1308    Visit Number 25    Date for PT Re-Evaluation 10/08/19    Authorization Type [employer]    PT Start Time 1231    PT Stop Time 1322    PT Time Calculation (min) 51 min    Activity Tolerance Patient tolerated treatment well    Behavior During Therapy [facility] for tasks assessed/performed           History reviewed. No pertinent past medical history.  Past Surgical History:  Procedure Laterality Date  . EXTERNAL FIXATION LEG Left 06/04/2019   Procedure: EXTERNAL FIXATION LEG;  Surgeon: Bjorn Pippin, MD;  [facility];  Service: Orthopedics;  Laterality: Left;  . OPEN REDUCTION INTERNAL FIXATION (ORIF) TIBIA/FIBULA FRACTURE Left 06/06/2019   Procedure: OPEN REDUCTION INTERNAL FIXATION (ORIF) TIBIA/FIBULA FRACTURE;  Surgeon: Roby Lofts, MD;  [facility];  Service: Orthopedics;  Laterality: Left;  . ORIF WRIST FRACTURE Left 06/04/2019  . ORIF WRIST FRACTURE Left 06/04/2019   Procedure: OPEN REDUCTION INTERNAL FIXATION (ORIF) WRIST FRACTURE;  Surgeon: Bjorn Pippin, MD;  [facility];  Service: Orthopedics;  Laterality: Left;    There were no vitals filed for this visit.   Subjective Assessment - 10/01/19 1233    Subjective I worked at the [employer] and I wasn't as sore as I was the weeks prior.  I did a walk around the block with my husband and it felt good.  He said my toe was facing out as I was walking.    Pertinent History ORIF Lt radius (06/04/19), ORIF Lt tibia (05/1119)  MD orders: NWB Lt LE, allow for active and passive ROM ankle, strengthening Lt LE, likely advance WB in 4 weeks      Currently in Pain? No/denies                             [facility] Adult PT Treatment/Exercise - 10/01/19 0001      Knee/Hip Exercises: Aerobic   Elliptical R3 L4 x 8 min    Other Aerobic Arm bike: level 1.5 x 6 minutes       Knee/Hip Exercises: Machines for Strengthening   Total Gym Leg Press Bil calf press 40# 2x15, LTLE leg press 30#2x20   VC for LE alignment     Knee/Hip Exercises: Standing   SLS on blue pod 3x30 seconds   1 finger dabbing counter top   Walking with Sports Cord sidestepping 20# x10 each    Other Standing Knee Exercises wall push-ups 2x10      Hand Exercises   Digiticizer green 2 min      Vasopneumatic   Number Minutes Vasopneumatic  10 minutes    Vasopnuematic Location  Ankle    Vasopneumatic Pressure High    Vasopneumatic Temperature  3 flakes      Ankle Exercises: Standing   BAPS Level 2;Standing   PF/DF 2x10, inversion/eversion x10, bil circles x 10 each   Rocker Board 3 minutes   hold into stretch then balance on flat surface     Ankle Exercises: Stretches   Gastroc  Stretch 3 reps;30 seconds   using rocker board                   PT Short Term Goals - 08/21/19 1149      PT SHORT TERM GOAL #1   Title be independent in initial HEP    Time 4    Period Weeks    Status Achieved    Target Date 07/22/19      PT SHORT TERM GOAL #2   Title demonstrate Lt grip strength to > or = to 25# to improve ability to hold objects in the Lt hand    Time 4    Period Weeks    Status Achieved    Target Date 07/22/19      PT SHORT TERM GOAL #3   Title demonstrate gait with PWB as prescribed by MD with use of walker    Period Weeks    Status Achieved    Target Date 07/22/19             PT Long Term Goals - 09/25/19 1118      PT LONG TERM GOAL #2   Title reduce FOTO for wrist to < or = to 36% limitation    Baseline 29%    Status Achieved                 Plan - 10/01/19 1247    Clinical Impression Statement Pt has  been able to spend more time on her feet at the [employer] and has been taking walks without increased pain and a noticeable improvement in muscular endurance.  Pt was challenged by standing Baps Board PF/Df and inversion/eversion today.  Pt required moderate assistance for board control. Pt denies any changes in use of her Lt hand or wrist with functional tasks.   Gait is mildly antalgic due to reduced ankle A/ROM and she tends to toe out to compensate.  Pt will continue to benefit from skilled PT to address Lt ankle flexibility, gait and proprioception to normalize gait pattern and return to prior level of function.    PT Frequency 2x / week    PT Duration 8 weeks    PT Treatment/Interventions ADLs/Self Care Home Management;Cryotherapy;Electrical Stimulation;Aquatic Therapy;Moist Heat;Traction;Ultrasound;Neuromuscular re-education;Therapeutic exercise;Therapeutic activities;Gait training;Stair training;Functional mobility training;Patient/family education;Manual techniques;Passive range of motion;Scar mobilization;Taping;Vasopneumatic Device    PT Next Visit Plan continue to work on gait, strength and priprioception.  Lt hand and wrist strength/endurnace as needed    PT Home Exercise Plan Access Code: [email or web address]    Consulted and Agree with Plan of Care Patient           Patient will benefit from skilled therapeutic intervention in order to improve the following deficits and impairments:  Decreased activity tolerance, Decreased balance, Decreased mobility, Decreased strength, Impaired flexibility, Decreased scar mobility, Pain, Impaired UE functional use, Decreased endurance, Abnormal gait, Decreased range of motion, Difficulty walking  Visit Diagnosis: Muscle weakness (generalized)  Other abnormalities of gait and mobility  Pain in left lower leg  Pain in left wrist     Problem List Patient Active Problem List   Diagnosis Date Noted  . Fracture of tibial shaft, closed 06/07/2019  .  Closed left pilon fracture, initial encounter 06/07/2019  . Fall 06/07/2019  . Closed fracture of left distal radius 06/04/2019     Lorrene Reid, PT 10/01/19 1:10 PM  [facility] [address] [place], [place], [address] Phone: [phone]  Fax:  [phone]  Name: Meriem Lemieux MRN: [id number] Date of Birth: 1966-01-15

## 2019-10-03 ENCOUNTER — Ambulatory Visit: Payer: BC Managed Care – PPO

## 2019-10-03 ENCOUNTER — Telehealth: Payer: Self-pay

## 2019-10-03 NOTE — Telephone Encounter (Signed)
PT called pt due to no-show appt.  Pt thought her appointment was at 2:00.  Confirmed appointment on Monday.

## 2019-10-07 ENCOUNTER — Ambulatory Visit: Payer: BC Managed Care – PPO

## 2019-10-07 ENCOUNTER — Other Ambulatory Visit: Payer: Self-pay

## 2019-10-07 DIAGNOSIS — M79662 Pain in left lower leg: Secondary | ICD-10-CM

## 2019-10-07 DIAGNOSIS — M6281 Muscle weakness (generalized): Secondary | ICD-10-CM

## 2019-10-07 DIAGNOSIS — R2689 Other abnormalities of gait and mobility: Secondary | ICD-10-CM

## 2019-10-07 DIAGNOSIS — M25532 Pain in left wrist: Secondary | ICD-10-CM

## 2019-10-07 NOTE — Therapy (Addendum)
[facility] [address], [place], [place], [address] Phone: [phone]   Fax:  [phone]  Physical Therapy Treatment  Patient Details  Name: Regina Goodman MRN: [id number] Date of Birth: 02-26-1966 Referring Provider (PT): Katha Hamming, MD   Encounter Date: 10/07/2019   PT End of Session - 10/07/19 1444    Visit Number 26    Date for PT Re-Evaluation 10/08/19    Authorization Type [employer]    PT Start Time 4196    PT Stop Time 1456    PT Time Calculation (min) 49 min    Activity Tolerance Patient tolerated treatment well    Behavior During Therapy [facility] for tasks assessed/performed           History reviewed. No pertinent past medical history.  Past Surgical History:  Procedure Laterality Date  . EXTERNAL FIXATION LEG Left 06/04/2019   Procedure: EXTERNAL FIXATION LEG;  Surgeon: Hiram Gash, MD;  Location: [place];  Service: Orthopedics;  Laterality: Left;  . OPEN REDUCTION INTERNAL FIXATION (ORIF) TIBIA/FIBULA FRACTURE Left 06/06/2019   Procedure: OPEN REDUCTION INTERNAL FIXATION (ORIF) TIBIA/FIBULA FRACTURE;  Surgeon: Shona Needles, MD;  Location: [place];  Service: Orthopedics;  Laterality: Left;  . ORIF WRIST FRACTURE Left 06/04/2019  . ORIF WRIST FRACTURE Left 06/04/2019   Procedure: OPEN REDUCTION INTERNAL FIXATION (ORIF) WRIST FRACTURE;  Surgeon: Hiram Gash, MD;  Location: [place];  Service: Orthopedics;  Laterality: Left;    There were no vitals filed for this visit.   Subjective Assessment - 10/07/19 1410    Subjective I am still getting swelling in my ankle.  I see the MD tomorrow.    Currently in Pain? No/denies              [facility] PT Assessment - 10/07/19 0001      Assessment   Medical Diagnosis Lt distal radius fracture s/p ORIF, Lt distal tibia fracture s/p ORIF     Referring Provider (PT) Katha Hamming, MD    Onset Date/Surgical Date 06/04/19      Prior Function    Level of Independence Independent      Observation/Other Assessments   Focus on Therapeutic Outcomes (FOTO)  29% limitation (wrist)      PROM   Left Ankle Dorsiflexion 14    Left Ankle Plantar Flexion 46    Left Ankle Inversion 35    Left Ankle Eversion 23      Strength   Right Hand Grip (lbs) 56    Left Hand Grip (lbs) 36    Left Ankle Dorsiflexion 5/5    Left Ankle Plantar Flexion 3+/5    Left Ankle Inversion 4+/5    Left Ankle Eversion 4+/5      Ambulation/Gait   Ambulation/Gait Yes    Gait Pattern Step-through pattern;Decreased stance time - left;Antalgic;Lateral hip instability    Ambulation Surface Level                         [facility] Adult PT Treatment/Exercise - 10/07/19 0001      Knee/Hip Exercises: Aerobic   Elliptical R3 L4 x 8 min    Other Aerobic Arm bike: level 1.5 x 6 minutes       Knee/Hip Exercises: Machines for Strengthening   Total Gym Leg Press Bil calf press 40# 2x15, LTLE leg press 30#2x20   VC for LE alignment     Knee/Hip Exercises: Standing  SLS on blue pod 3x30 seconds   1 finger dabbing counter top   Other Standing Knee Exercises wall push-ups 2x10      Vasopneumatic   Number Minutes Vasopneumatic  10 minutes    Vasopnuematic Location  Ankle    Vasopneumatic Pressure High    Vasopneumatic Temperature  3 flakes      Ankle Exercises: Standing   Rocker Board 3 minutes   hold into stretch then balance on flat surface     Ankle Exercises: Stretches   Gastroc Stretch 3 reps;30 seconds   using rocker board                   PT Short Term Goals - 08/21/19 1149      PT SHORT TERM GOAL #1   Title be independent in initial HEP    Time 4    Period Weeks    Status Achieved    Target Date 07/22/19      PT SHORT TERM GOAL #2   Title demonstrate Lt grip strength to > or = to 25# to improve ability to hold objects in the Lt hand    Time 4    Period Weeks    Status Achieved    Target Date 07/22/19      PT SHORT TERM  GOAL #3   Title demonstrate gait with PWB as prescribed by MD with use of walker    Period Weeks    Status Achieved    Target Date 07/22/19             PT Long Term Goals - 10/07/19 1413      PT LONG TERM GOAL #1   Title be independent in advanced HEP    Status Achieved      PT LONG TERM GOAL #2   Title reduce FOTO for wrist to < or = to 36% limitation    Baseline 29%    Status Achieved      PT LONG TERM GOAL #3   Title demonstrate Lt ankle strength to 4-/5 throughout to improve stability when allowed to weightbear with gait    Baseline 4+/5 to 5/5 except PF 3+/5    Status Partially Met      PT LONG TERM GOAL #4   Title demonstrate Lt grip strength to > or  = to 45# to improve ability to hold objects in the Lt UE    Baseline 36#    Status Partially Met      PT LONG TERM GOAL #5   Title demonstrate > or = to 4/5 to 4+/5 Lt wrist strength throughout to improve endurance with use for ADLs and self-care    Status Achieved      PT LONG TERM GOAL #6   Title ambulate with WBAT with cane or walker as prescribed by MD for all distances    Baseline antalgia with gait due to limited Lt ankle A/ROM.    Status Achieved                 Plan - 10/07/19 1435    Clinical Impression Statement Pt has been able to spend more time on her feet at the [employer] and has been taking walks without increased pain and a noticeable improvement in muscular endurance.  Pt demonstrates Lt ankle A/ROM WFLs and ankle strength is full except plantarflexion is 3+/5.  Pt demonstrates mild gait antalgia on level surface with lateral hip instability. SLS on the Lt LE  is 6 seconds with hip instability noted.  Lt grip is 36# vs 56 n the Rt.  Pt has a HEP in place to address Lt hip strength, gait symmetry and Lt grip strength.  Pt will see the MD tomorrow to discuss further PT.  PT will do recert next session if pt is going to continue PT.    PT Frequency 2x / week    PT Duration 8 weeks    PT  Treatment/Interventions ADLs/Self Care Home Management;Cryotherapy;Electrical Stimulation;Aquatic Therapy;Moist Heat;Traction;Ultrasound;Neuromuscular re-education;Therapeutic exercise;Therapeutic activities;Gait training;Stair training;Functional mobility training;Patient/family education;Manual techniques;Passive range of motion;Scar mobilization;Taping;Vasopneumatic Device    PT Next Visit Plan see what MD says.  ERO if pt returns next week.    PT Home Exercise Plan Access Code: [email or web address]    Consulted and Agree with Plan of Care Patient           Patient will benefit from skilled therapeutic intervention in order to improve the following deficits and impairments:  Decreased activity tolerance, Decreased balance, Decreased mobility, Decreased strength, Impaired flexibility, Decreased scar mobility, Pain, Impaired UE functional use, Decreased endurance, Abnormal gait, Decreased range of motion, Difficulty walking  Visit Diagnosis: Muscle weakness (generalized)  Other abnormalities of gait and mobility  Pain in left lower leg  Pain in left wrist     Problem List Patient Active Problem List   Diagnosis Date Noted  . Fracture of tibial shaft, closed 06/07/2019  . Closed left pilon fracture, initial encounter 06/07/2019  . Fall 06/07/2019  . Closed fracture of left distal radius 06/04/2019     Sigurd Sos, PT 10/07/19 2:47 PM  PHYSICAL THERAPY DISCHARGE SUMMARY  Visits from Start of Care: 26  Current functional level related to goals / functional outcomes: See above for current status.  Pt has started exercising at [employer] and will continue with strength progression at the gym.  Remaining deficits: Gait asymmetry and ankle weakness.  Pt will continue to work on strength and flexibility.    Education / Equipment: HEP Plan: Patient agrees to discharge.  Patient goals were partially met. Patient is being discharged due to being pleased with the current functional  level.  ?????        Sigurd Sos, PT 10/16/19 9:28 AM   [facility] [address] [place], [place], [address] Phone: [phone]   Fax:  [phone]  Name: Regina Goodman MRN: [id number] Date of Birth: 1965/12/24

## 2019-10-11 ENCOUNTER — Ambulatory Visit: Payer: BC Managed Care – PPO | Admitting: Physical Therapy

## 2019-10-14 ENCOUNTER — Ambulatory Visit: Payer: BC Managed Care – PPO

## 2019-10-16 ENCOUNTER — Encounter: Payer: BC Managed Care – PPO | Admitting: Physical Therapy

## 2019-10-17 ENCOUNTER — Other Ambulatory Visit: Payer: Self-pay

## 2019-10-17 ENCOUNTER — Ambulatory Visit
Admission: RE | Admit: 2019-10-17 | Discharge: 2019-10-17 | Disposition: A | Discharge status: Home / Self Care | Payer: BC Managed Care – PPO | Source: Ambulatory Visit

## 2019-10-17 DIAGNOSIS — Z1231 Encounter for screening mammogram for malignant neoplasm of breast: Secondary | ICD-10-CM

## 2019-10-21 ENCOUNTER — Encounter: Payer: BC Managed Care – PPO | Admitting: Physical Therapy

## 2019-10-29 ENCOUNTER — Other Ambulatory Visit: Payer: Self-pay

## 2019-10-29 ENCOUNTER — Encounter: Payer: Self-pay | Admitting: Sports Medicine

## 2019-10-29 ENCOUNTER — Ambulatory Visit: Payer: BC Managed Care – PPO | Admitting: Sports Medicine

## 2019-10-29 DIAGNOSIS — M722 Plantar fascial fibromatosis: Secondary | ICD-10-CM | POA: Diagnosis not present

## 2019-10-29 DIAGNOSIS — M79672 Pain in left foot: Secondary | ICD-10-CM | POA: Diagnosis not present

## 2019-10-29 NOTE — Progress Notes (Signed)
Subjective: Regina Goodman is a 54 y.o. female patient who presents to office for evaluation of raised soft tissue knot noted at the bottom of the left foot in the arch.  Patient reports that she had left ankle surgery June 06, 2019 and when she was doing physical therapy she noted that the knot appeared in her arch and became hard and sore reports that the pain since has resolved but she wants the area checked because she is not sure if this is normal or if this is something she should do more about.  Patient denies any significant redness warmth or current pain trauma or injury at the left arch.  No other issues noted.  Review of systems noncontributory  Patient Active Problem List   Diagnosis Date Noted  . Fracture of tibial shaft, closed 06/07/2019  . Closed left pilon fracture, initial encounter 06/07/2019  . Fall 06/07/2019  . Closed fracture of left distal radius 06/04/2019    Current Outpatient Medications on File Prior to Visit  Medication Sig Dispense Refill  . Cholecalciferol (VITAMIN D-3) 125 MCG (5000 UT) TABS Take 5,000 Units by mouth daily. 90 tablet 1  . enoxaparin (LOVENOX) 40 MG/0.4ML injection Inject 0.4 mLs (40 mg total) into the skin daily for 21 days. 8.4 mL 0  . HYDROcodone-acetaminophen (NORCO/VICODIN) 5-325 MG tablet Take 1 tablet by mouth every 8 (eight) hours as needed for severe pain. (Patient not taking: Reported on 06/24/2019) 10 tablet 0  . sertraline (ZOLOFT) 100 MG tablet Take 100 mg by mouth daily.     No current facility-administered medications on file prior to visit.    Allergies  Allergen Reactions  . Penicillins Hives and Swelling    Objective:  General: Alert and oriented x3 in no acute distress  Dermatology: No open lesions bilateral lower extremities, no webspace macerations, no ecchymosis bilateral, all nails x 10 are well manicured.  Raised soft tissue mass noted at the left plantar arch that measures 1.5 x 1 cm without pulsation or  signs of infection consistent likely with plantar fibroma.  All scars ankle well-healed.  Vascular: Dorsalis Pedis and Posterior Tibial pedal pulses palpable, Capillary Fill Time 3 seconds,(+) pedal hair growth bilateral, no edema bilateral lower extremities, Temperature gradient within normal limits.  Neurology: Michaell Cowing sensation intact via light touch bilateral.  Musculoskeletal: No reproducible tenderness with palpation at left medial arch.  Taut plantar fascial.  Assessment and Plan: Problem List Items Addressed This Visit    None    Visit Diagnoses    Plantar fibromatosis    -  Primary   Arch pain of left foot           -Complete examination performed -Discussed treatment options for asymptomatic plantar fibroma left arch -Advised gentle stretching as instructed -Advised ice in the evening for 15 minutes with a frozen water bottle -Advised patient to try topical Voltaren as needed twice daily if area grows larger to return to office or to call for prescription for me to send topical steroid -Advised good supportive shoes daily for foot type -Patient to return to office as needed or sooner if condition worsens.  Asencion Islam, DPM

## 2019-10-29 NOTE — Patient Instructions (Signed)
Voltaren over the counter twice a day at area of plantar fibroma

## 2020-09-08 ENCOUNTER — Other Ambulatory Visit: Payer: Self-pay | Admitting: Obstetrics and Gynecology

## 2020-09-08 DIAGNOSIS — Z1231 Encounter for screening mammogram for malignant neoplasm of breast: Secondary | ICD-10-CM

## 2020-11-02 ENCOUNTER — Other Ambulatory Visit: Payer: Self-pay

## 2020-11-02 ENCOUNTER — Ambulatory Visit
Admission: RE | Admit: 2020-11-02 | Discharge: 2020-11-02 | Disposition: A | Discharge status: Home / Self Care | Payer: BC Managed Care – PPO | Source: Ambulatory Visit | Attending: Obstetrics and Gynecology | Admitting: Obstetrics and Gynecology

## 2020-11-02 DIAGNOSIS — Z1231 Encounter for screening mammogram for malignant neoplasm of breast: Secondary | ICD-10-CM

## 2021-03-07 IMAGING — MG DIGITAL SCREENING BILAT W/ TOMO W/ CAD
8 series · 9 of 24 positions shown · non-contrast
Comparison: Previous exam(s).

CLINICAL DATA: Screening.

EXAM:
DIGITAL SCREENING BILATERAL MAMMOGRAM WITH TOMO AND CAD

[R CC synth-2D]
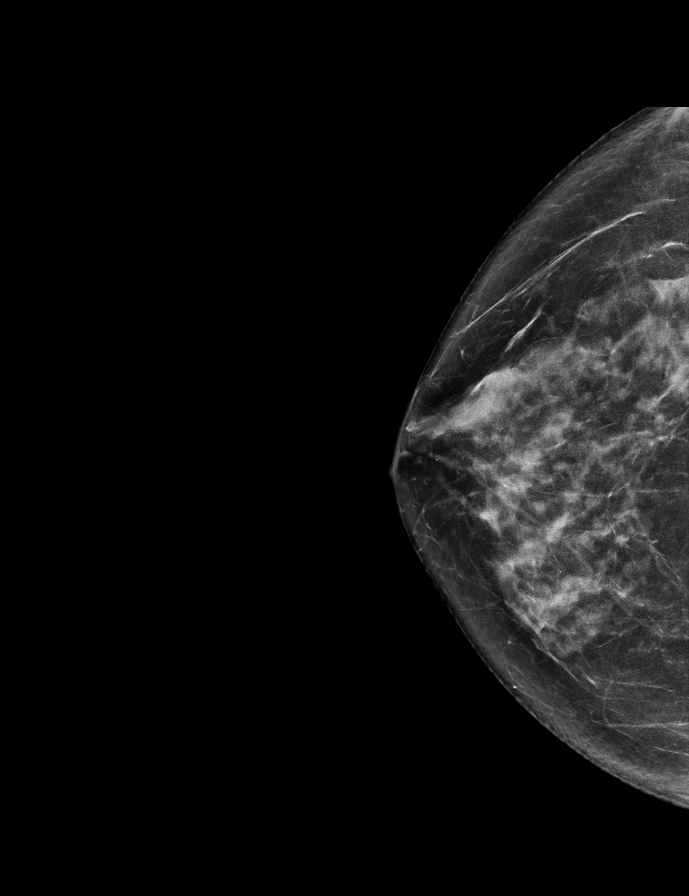

[R MLO synth-2D]
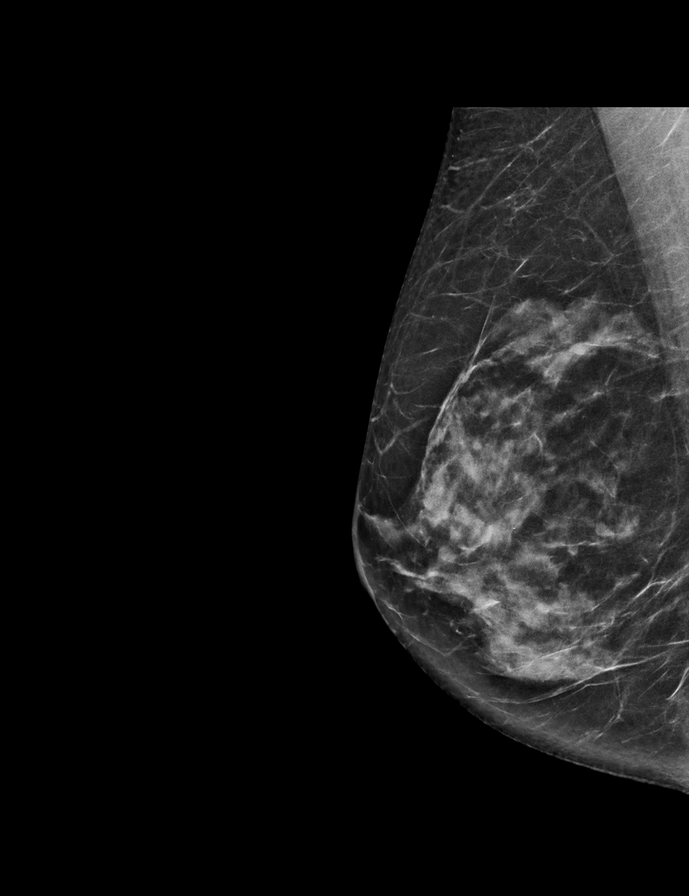

[L CC synth-2D]
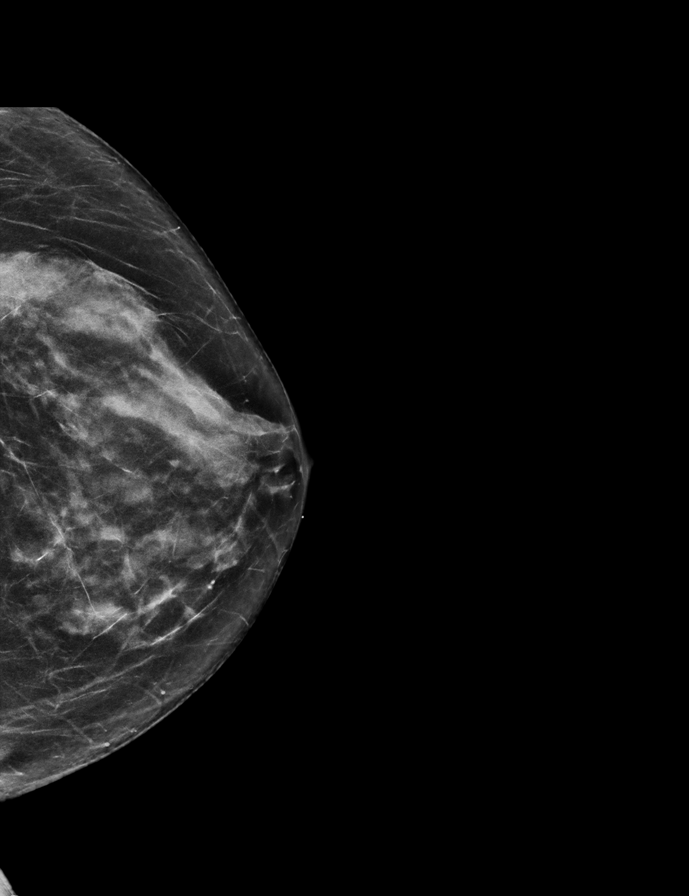

[L MLO synth-2D]
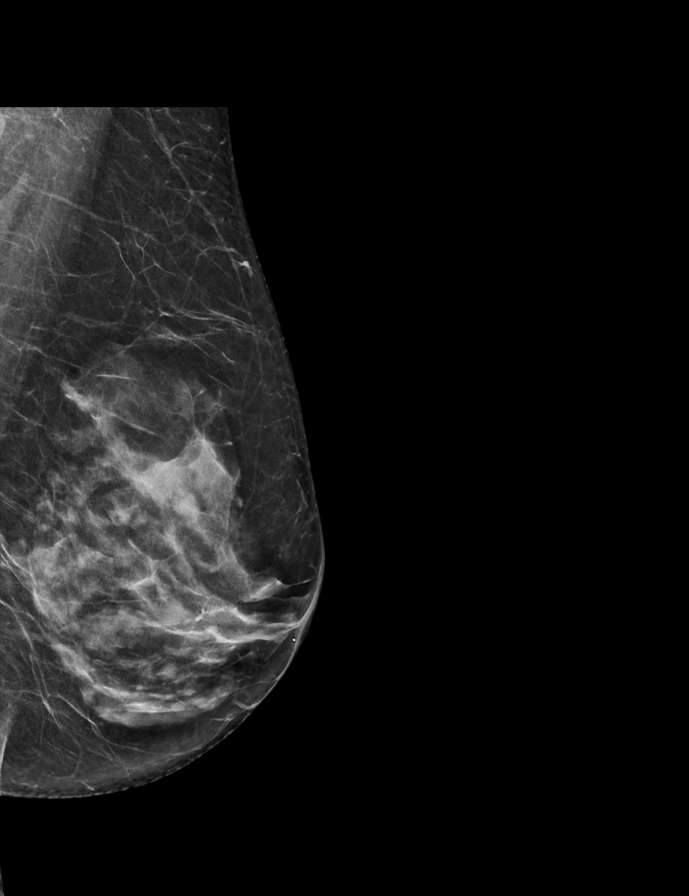

[L MLO tomo · 2 of 64 frames shown]
[frame 21/64]
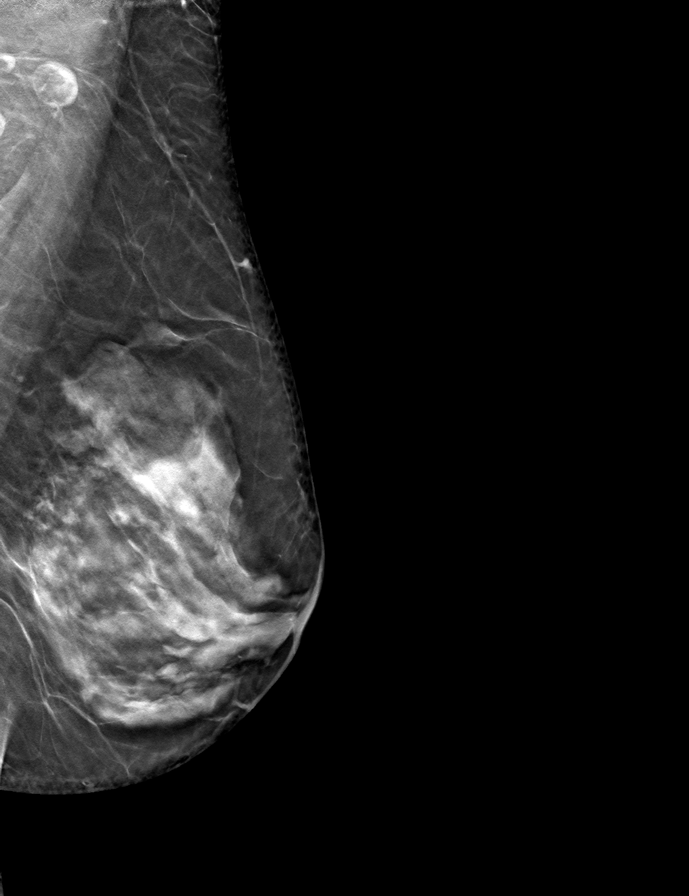
[frame 33/64]
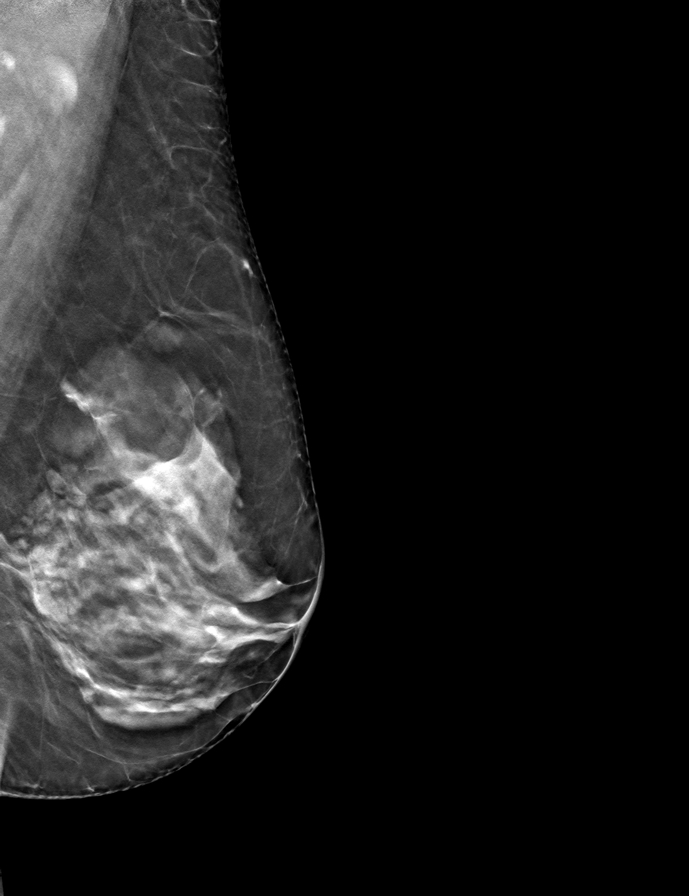

[R CC tomo · tomo slice 33/64.0]
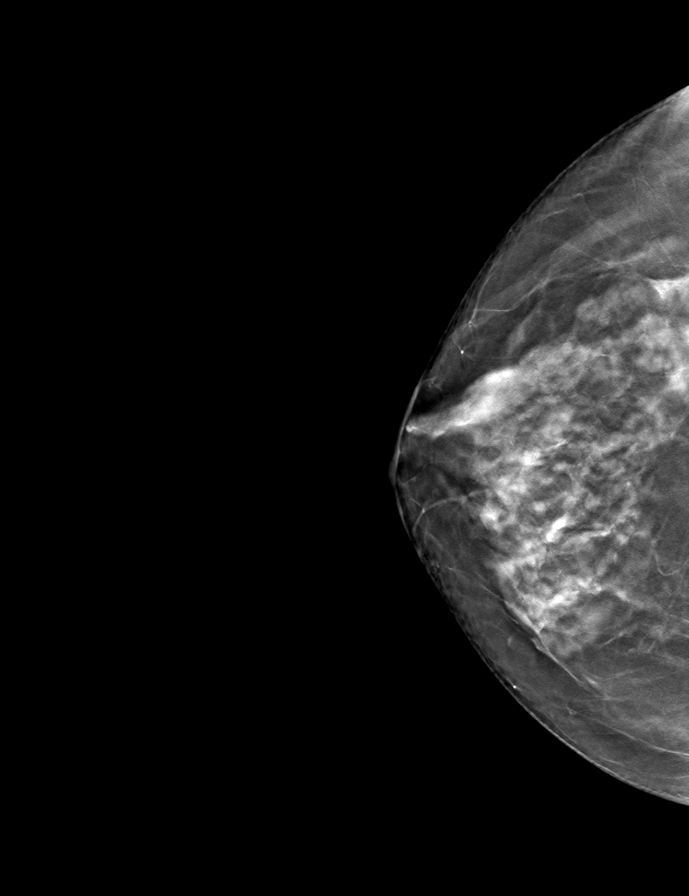

[R MLO tomo · tomo slice 32/63.0]
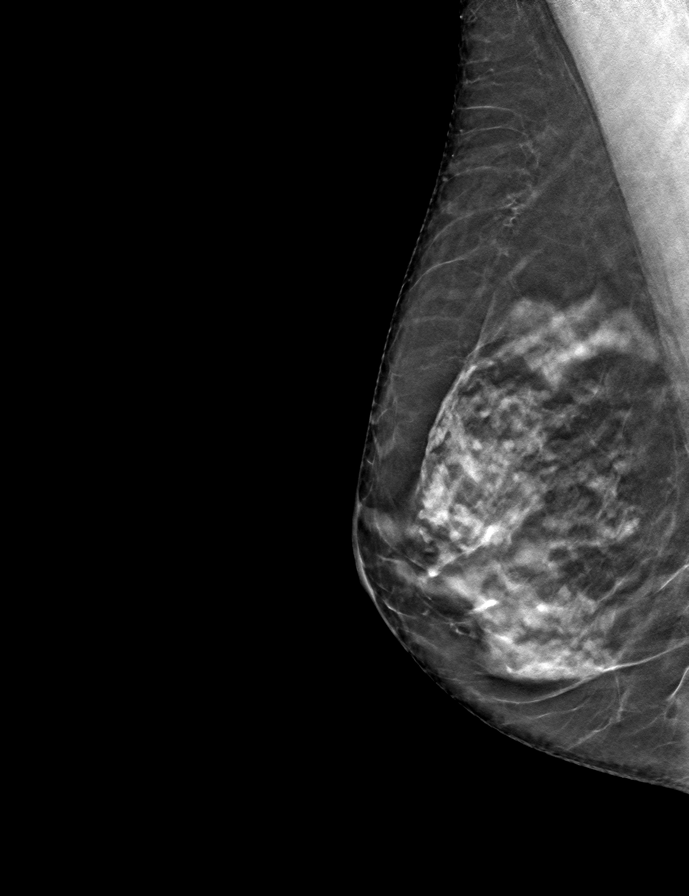

[L CC tomo · tomo slice 31/60.0]
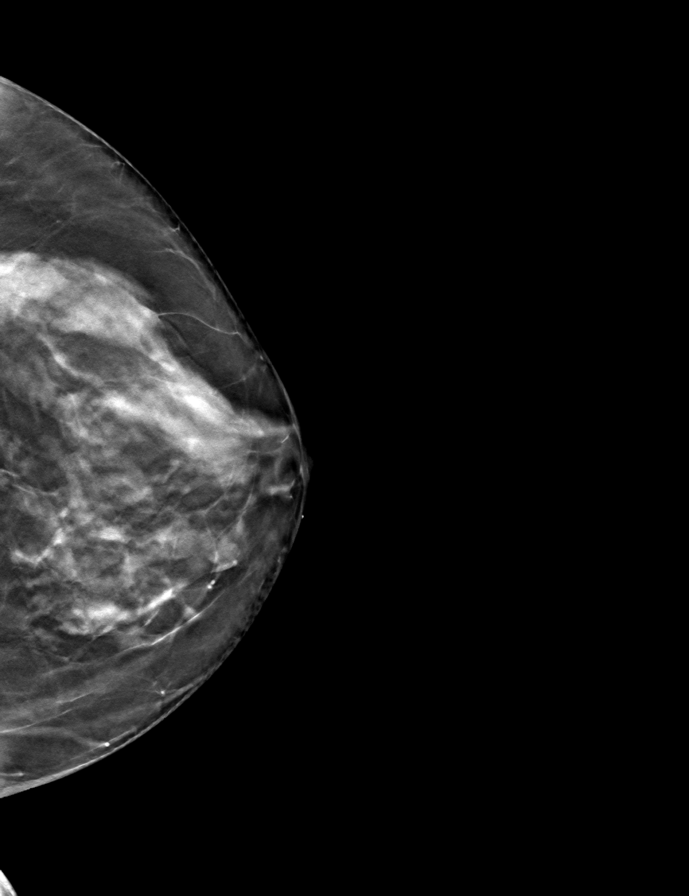

[9 of 24 positions shown; findings below may reference images not displayed]

ACR Breast Density Category c: The breast tissue is heterogeneously
dense, which may obscure small masses.
FINDINGS: There are no findings suspicious for malignancy. Images were
processed with CAD.
IMPRESSION: No mammographic evidence of malignancy. A result letter of this
screening mammogram will be mailed directly to the patient.

RECOMMENDATION:
Screening mammogram in one year. (Code:[id number])

BI-RADS CATEGORY  1: Negative.

## 2021-10-12 ENCOUNTER — Other Ambulatory Visit: Payer: Self-pay | Admitting: Obstetrics and Gynecology

## 2021-10-12 DIAGNOSIS — Z1231 Encounter for screening mammogram for malignant neoplasm of breast: Secondary | ICD-10-CM

## 2021-11-08 ENCOUNTER — Ambulatory Visit
Admission: RE | Admit: 2021-11-08 | Discharge: 2021-11-08 | Disposition: A | Discharge status: Home / Self Care | Payer: BC Managed Care – PPO | Source: Ambulatory Visit | Attending: Obstetrics and Gynecology | Admitting: Obstetrics and Gynecology

## 2021-11-08 DIAGNOSIS — Z1231 Encounter for screening mammogram for malignant neoplasm of breast: Secondary | ICD-10-CM

## 2022-10-17 ENCOUNTER — Other Ambulatory Visit: Payer: Self-pay | Admitting: Obstetrics and Gynecology

## 2022-10-17 DIAGNOSIS — Z1231 Encounter for screening mammogram for malignant neoplasm of breast: Secondary | ICD-10-CM

## 2022-11-11 ENCOUNTER — Ambulatory Visit: Payer: BC Managed Care – PPO

## 2022-11-14 ENCOUNTER — Ambulatory Visit
Admission: RE | Admit: 2022-11-14 | Discharge: 2022-11-14 | Disposition: A | Discharge status: Home / Self Care | Payer: BC Managed Care – PPO | Source: Ambulatory Visit | Attending: Obstetrics and Gynecology | Admitting: Obstetrics and Gynecology

## 2022-11-14 DIAGNOSIS — Z1231 Encounter for screening mammogram for malignant neoplasm of breast: Secondary | ICD-10-CM

## 2023-11-08 ENCOUNTER — Other Ambulatory Visit: Payer: Self-pay | Admitting: Obstetrics and Gynecology

## 2023-11-08 DIAGNOSIS — Z1231 Encounter for screening mammogram for malignant neoplasm of breast: Secondary | ICD-10-CM

## 2023-11-22 ENCOUNTER — Ambulatory Visit
Admission: RE | Admit: 2023-11-22 | Discharge: 2023-11-22 | Disposition: A | Discharge status: Home / Self Care | Payer: Self-pay | Source: Ambulatory Visit | Attending: Obstetrics and Gynecology | Admitting: Obstetrics and Gynecology

## 2023-11-22 DIAGNOSIS — Z1231 Encounter for screening mammogram for malignant neoplasm of breast: Secondary | ICD-10-CM

## 2023-11-29 ENCOUNTER — Other Ambulatory Visit: Payer: Self-pay | Admitting: Obstetrics and Gynecology

## 2023-11-29 DIAGNOSIS — R928 Other abnormal and inconclusive findings on diagnostic imaging of breast: Secondary | ICD-10-CM

## 2023-12-05 ENCOUNTER — Ambulatory Visit
Admission: RE | Admit: 2023-12-05 | Discharge: 2023-12-05 | Disposition: A | Discharge status: Home / Self Care | Source: Ambulatory Visit | Attending: Obstetrics and Gynecology | Admitting: Obstetrics and Gynecology

## 2023-12-05 ENCOUNTER — Ambulatory Visit

## 2023-12-05 DIAGNOSIS — R928 Other abnormal and inconclusive findings on diagnostic imaging of breast: Secondary | ICD-10-CM
